# Patient Record
Sex: Female | Born: 1970 | Race: Asian | Hispanic: No | Marital: Single | Smoking: Never smoker
Health system: Southern US, Community
[De-identification: ages and names within clinical notes are randomized; demographics above are authoritative.]

## PROBLEM LIST (undated history)

## (undated) DIAGNOSIS — E785 Hyperlipidemia, unspecified: Secondary | ICD-10-CM

## (undated) DIAGNOSIS — I10 Essential (primary) hypertension: Secondary | ICD-10-CM

## (undated) DIAGNOSIS — R7303 Prediabetes: Secondary | ICD-10-CM

## (undated) HISTORY — DX: Hyperlipidemia, unspecified: E78.5

## (undated) HISTORY — DX: Prediabetes: R73.03

## (undated) HISTORY — DX: Essential (primary) hypertension: I10

---

## 1997-03-24 ENCOUNTER — Inpatient Hospital Stay (HOSPITAL_COMMUNITY): Admission: AD | Admit: 1997-03-24 | Discharge: 1997-03-25 | Payer: Self-pay | Admitting: Obstetrics

## 2004-12-27 ENCOUNTER — Ambulatory Visit (HOSPITAL_COMMUNITY): Admission: RE | Admit: 2004-12-27 | Discharge: 2004-12-27 | Payer: Self-pay | Admitting: *Deleted

## 2011-08-01 ENCOUNTER — Ambulatory Visit (INDEPENDENT_AMBULATORY_CARE_PROVIDER_SITE_OTHER): Payer: PRIVATE HEALTH INSURANCE | Admitting: Family Medicine

## 2011-08-01 VITALS — BP 150/94 | HR 77 | Temp 98.5°F | Resp 16 | Ht 61.5 in | Wt 185.8 lb

## 2011-08-01 DIAGNOSIS — R8281 Pyuria: Secondary | ICD-10-CM

## 2011-08-01 DIAGNOSIS — R82998 Other abnormal findings in urine: Secondary | ICD-10-CM

## 2011-08-01 DIAGNOSIS — R1031 Right lower quadrant pain: Secondary | ICD-10-CM

## 2011-08-01 DIAGNOSIS — N390 Urinary tract infection, site not specified: Secondary | ICD-10-CM

## 2011-08-01 LAB — POCT URINALYSIS DIPSTICK
Bilirubin, UA: NEGATIVE
Blood, UA: NEGATIVE
Glucose, UA: NEGATIVE
Nitrite, UA: NEGATIVE
Protein, UA: NEGATIVE
Spec Grav, UA: 1.015
Urobilinogen, UA: 0.2
pH, UA: 7

## 2011-08-01 LAB — POCT CBC
Granulocyte percent: 66.5 %G (ref 37–80)
HCT, POC: 43.2 % (ref 37.7–47.9)
Hemoglobin: 13 g/dL (ref 12.2–16.2)
Lymph, poc: 2.4 (ref 0.6–3.4)
MCH, POC: 27.4 pg (ref 27–31.2)
MCHC: 30.1 g/dL — AB (ref 31.8–35.4)
MCV: 91.2 fL (ref 80–97)
POC Granulocyte: 6.1 (ref 2–6.9)
POC LYMPH PERCENT: 26.3 %L (ref 10–50)
POC MID %: 7.2 %M (ref 0–12)
Platelet Count, POC: 362 10*3/uL (ref 142–424)
RDW, POC: 13.4 %
WBC: 9.1 10*3/uL (ref 4.6–10.2)

## 2011-08-01 LAB — POCT UA - MICROSCOPIC ONLY
Casts, Ur, LPF, POC: NEGATIVE
Crystals, Ur, HPF, POC: NEGATIVE
Yeast, UA: NEGATIVE

## 2011-08-01 LAB — POCT URINE PREGNANCY: Preg Test, Ur: NEGATIVE

## 2011-08-01 MED ORDER — NITROFURANTOIN MONOHYD MACRO 100 MG PO CAPS: 100.0000 mg | ORAL_CAPSULE | Freq: Two times a day (BID) | ORAL | Status: AC

## 2011-08-01 NOTE — Progress Notes (Signed)
Subjective:    Patient ID: Kristin Matthews, female    DOB: [birth year], 41 y.o.   MRN: [id number]  HPI Kristin Matthews is a 40 y.o. female R sided abdominal pain - started 2 days ago.  NKI.  Worse then, some better now.  No measured fever.  Feels hot at times, but no measured fever. No vomiting. Feels sick - "not feeling well", but not nausea..  Normal BM this am. LMP - normal last month - around June 20th.  Hurts to eat, but has been able to keep food down.   No alcohol, no tobacco.   Works in Development worker, community.    Review of Systems  Constitutional: Positive for fever.       Subjective fever.  Gastrointestinal: Positive for abdominal pain. Negative for nausea, vomiting and blood in stool.  Genitourinary: Negative for dysuria, urgency, frequency, hematuria, vaginal bleeding, vaginal discharge, difficulty urinating and vaginal pain.      Objective:   Physical Exam  Constitutional: She is oriented to person, place, and time. She appears well-developed and well-nourished.  HENT:  Head: Normocephalic and atraumatic.  Pulmonary/Chest: Effort normal.  Abdominal: Soft. Normal appearance and bowel sounds are normal. She exhibits no distension, no ascites and no mass. There is no hepatosplenomegaly. There is tenderness in the right lower quadrant. There is tenderness at McBurney's point. There is no rigidity, no rebound, no guarding, no CVA tenderness and negative Murphy's sign. No hernia.         Negative heel jar, negative ileopsoas sign.  Musculoskeletal:       Right hip: She exhibits no tenderness.  Neurological: She is alert and oriented to person, place, and time.  Skin: Skin is warm and dry. No rash noted.  Psychiatric: She has a normal mood and affect. Her behavior is normal. Thought content normal.    Results for orders placed in visit on 08/01/11  POCT URINALYSIS DIPSTICK      Component Value Range   Color, UA yellow     Clarity, UA cloudy     Glucose, UA neg     Bilirubin, UA neg     Ketones,  UA neg     Spec Grav, UA 1.015     Blood, UA neg     pH, UA 7.0     Protein, UA neg     Urobilinogen, UA 0.2     Nitrite, UA neg     Leukocytes, UA small (1+)    POCT UA - MICROSCOPIC ONLY      Component Value Range   WBC, Ur, HPF, POC 10-15     RBC, urine, microscopic 1-3     Bacteria, U Microscopic 1+     Mucus, UA small     Epithelial cells, urine per micros 5-8     Crystals, Ur, HPF, POC neg     Casts, Ur, LPF, POC neg     Yeast, UA neg    POCT CBC      Component Value Range   WBC 9.1  4.6 - 10.2 K/uL   Lymph, poc 2.4  0.6 - 3.4   POC LYMPH PERCENT 26.3  10 - 50 %L   MID (cbc) 0.7  0 - 0.9   POC MID % 7.2  0 - 12 %M   POC Granulocyte 6.1  2 - 6.9   Granulocyte percent 66.5  37 - 80 %G   RBC 4.74  4.04 - 5.48 M/uL   Hemoglobin 13.0  12.2 -  16.2 g/dL   HCT, POC 16.1  09.6 - 47.9 %   MCV 91.2  80 - 97 fL   MCH, POC 27.4  27 - 31.2 pg   MCHC 30.1 (*) 31.8 - 35.4 g/dL   RDW, POC 04.5     Platelet Count, POC 362  142 - 424 K/uL   MPV 10.2  0 - 99.8 fL  POCT URINE PREGNANCY      Component Value Range   Preg Test, Ur Negative         Assessment & Plan:  Kristin Matthews is a 41 y.o. female 1. RLQ abdominal pain  POCT urinalysis dipstick, POCT UA - Microscopic Only, POCT CBC, POCT urine pregnancy, Urine culture  2. Pyuria  Urine culture  3. UTI (lower urinary tract infection)  Urine culture   Reassuring CBC, and hx of improving symptoms pas 2 days.  Pyuria with lower abd pain - possible early UTI. DDX ovarian cyst based on timing of LMP.  Start macrobid 100mg  BID,  Check urine cx. Recheck in next 48 hours if not improving, sooner if any fever, vomiting, or worsening/changing abd pain. Understanding expressed.

## 2011-08-01 NOTE — Patient Instructions (Signed)
Your blood test is in normal range.  There were infection fighting cells in your urine that indicates you may have a bladder infection that is causing these symptoms.  Start antibiotic and drink plenty of fluids as discussed below.  If your pain is not continuing to improve in the next 2 days - return to clinic to recheck. Return to the clinic or go to the nearest emergency room if any of your symptoms worsen or new symptoms occur. Your should receive a call or letter about your lab results within the next week to 10 days.     Urinary Tract Infection Infections of the urinary tract can start in several places. A bladder infection (cystitis), a kidney infection (pyelonephritis), and a prostate infection (prostatitis) are different types of urinary tract infections (UTIs). They usually get better if treated with medicines (antibiotics) that kill germs. Take all the medicine until it is gone. You or your child may feel better in a few days, but TAKE ALL MEDICINE or the infection may not respond and may become more difficult to treat. HOME CARE INSTRUCTIONS   Drink enough water and fluids to keep the urine clear or pale yellow. Cranberry juice is especially recommended, in addition to large amounts of water.   Avoid caffeine, tea, and carbonated beverages. They tend to irritate the bladder.   Alcohol may irritate the prostate.   Only take over-the-counter or prescription medicines for pain, discomfort, or fever as directed by your caregiver.  To prevent further infections:  Empty the bladder often. Avoid holding urine for long periods of time.   After a bowel movement, women should cleanse from front to back. Use each tissue only once.   Empty the bladder before and after sexual intercourse.  FINDING OUT THE RESULTS OF YOUR TEST Not all test results are available during your visit. If your or your child's test results are not back during the visit, make an appointment with your caregiver to find  out the results. Do not assume everything is normal if you have not heard from your caregiver or the medical facility. It is important for you to follow up on all test results. SEEK MEDICAL CARE IF:   There is back pain.   Your baby is older than 3 months with a rectal temperature of 100.5 F (38.1 C) or higher for more than 1 day.   Your or your child's problems (symptoms) are no better in 3 days. Return sooner if you or your child is getting worse.  SEEK IMMEDIATE MEDICAL CARE IF:   There is severe back pain or lower abdominal pain.   You or your child develops chills.   You have a fever.   Your baby is older than 3 months with a rectal temperature of 102 F (38.9 C) or higher.   Your baby is 83 months old or younger with a rectal temperature of 100.4 F (38 C) or higher.   There is nausea or vomiting.   There is continued burning or discomfort with urination.  MAKE SURE YOU:   Understand these instructions.   Will watch your condition.   Will get help right away if you are not doing well or get worse.  Document Released: 10/25/2004 Document Revised: 01/04/2011 Document Reviewed: 05/30/2006 [facility] Patient Information 2012 [place], [place].

## 2011-08-03 LAB — URINE CULTURE: Colony Count: 8000

## 2011-08-07 ENCOUNTER — Encounter: Payer: Self-pay | Admitting: Family Medicine

## 2011-11-10 ENCOUNTER — Other Ambulatory Visit: Payer: Self-pay | Admitting: Radiology

## 2011-11-10 ENCOUNTER — Ambulatory Visit (INDEPENDENT_AMBULATORY_CARE_PROVIDER_SITE_OTHER): Payer: PRIVATE HEALTH INSURANCE | Admitting: Family Medicine

## 2011-11-10 VITALS — BP 148/104 | HR 68 | Temp 98.2°F | Resp 18 | Ht 61.25 in | Wt 184.4 lb

## 2011-11-10 DIAGNOSIS — H1030 Unspecified acute conjunctivitis, unspecified eye: Secondary | ICD-10-CM

## 2011-11-10 MED ORDER — CIPROFLOXACIN HCL 0.3 % OP SOLN: 1.0000 [drp] | OPHTHALMIC | Status: DC

## 2011-11-10 NOTE — Telephone Encounter (Signed)
The eye drops did not go through.

## 2011-11-15 ENCOUNTER — Telehealth: Payer: Self-pay

## 2011-11-15 ENCOUNTER — Ambulatory Visit (INDEPENDENT_AMBULATORY_CARE_PROVIDER_SITE_OTHER): Payer: PRIVATE HEALTH INSURANCE | Admitting: Family Medicine

## 2011-11-15 VITALS — BP 132/88 | HR 79 | Temp 98.7°F | Resp 16 | Ht 61.5 in | Wt 171.0 lb

## 2011-11-15 DIAGNOSIS — H209 Unspecified iridocyclitis: Secondary | ICD-10-CM

## 2011-11-15 MED ORDER — PREDNISOLONE ACETATE 1 % OP SUSP: 1.0000 [drp] | Freq: Two times a day (BID) | OPHTHALMIC | Status: DC

## 2011-11-15 NOTE — Telephone Encounter (Signed)
Pt would like for someone to contact her concerning her eye she was in for ov and states she called a couple days ago and no response. [phone]

## 2011-11-15 NOTE — Progress Notes (Signed)
41 yo woman with red left eye  X 1 week with dull headache and hazy vision.  She was seen 5 days ago and given ciloxan which has not helped.  Objective:  Markedly injected left eye Fundus:  Poorly visualized. EOMI:   Flourescein:  No uptake  Assessment:  Acute iritis  Plan:  predforte 1. Iritis  prednisoLONE acetate (PRED FORTE) 1 % ophthalmic suspension, Ambulatory referral to Ophthalmology

## 2011-11-15 NOTE — Patient Instructions (Signed)
We will call you tomorrow to refer you to the eye specialist.   Take one drop of new medicine tonight and one tomorrow morning.

## 2012-04-25 NOTE — Progress Notes (Signed)
  Subjective:    Patient ID: Kristin Matthews, female    DOB: 1970-09-25, 42 y.o.   MRN: [id number] Chief Complaint  Patient presents with  . Conjunctivitis    x yesterday   HPI  Bilateral red eyes dev yesterday.  Matted when she awoke this morning. Scratchy and painful.  No known contacts.  No past medical history on file. No current outpatient prescriptions on file prior to visit.   No current facility-administered medications on file prior to visit.   No Known Allergies  Review of Systems  Constitutional: Negative for fever, chills, diaphoresis and activity change.  HENT: Negative for ear pain, congestion, sore throat, rhinorrhea, neck pain, neck stiffness, dental problem, sinus pressure and tinnitus.   Eyes: Positive for photophobia, pain, discharge, redness, itching and visual disturbance.  Skin: Negative for rash.  Neurological: Negative for dizziness, syncope, facial asymmetry, weakness, light-headedness, numbness and headaches.  Hematological: Negative for adenopathy.  Psychiatric/Behavioral: Negative for sleep disturbance.      BP 148/104  Pulse 68  Temp(Src) 98.2 F (36.8 C) (Oral)  Resp 18  Ht 5' 1.25" (1.556 m)  Wt 184 lb 6.4 oz (83.643 kg)  BMI 34.55 kg/m2  SpO2 100% Objective:   Physical Exam  Constitutional: She is oriented to person, place, and time. She appears well-developed and well-nourished. No distress.  HENT:  Head: Normocephalic and atraumatic.  Right Ear: External ear normal.  Eyes: EOM are normal. Pupils are equal, round, and reactive to light. Right eye exhibits discharge. Right eye exhibits no chemosis and no exudate. Left eye exhibits discharge. Left eye exhibits no chemosis and no exudate. Right conjunctiva is injected. Left conjunctiva is injected. No scleral icterus.  Fundoscopic exam:      The right eye shows no hemorrhage and no papilledema.       The left eye shows no hemorrhage and no papilledema.  Bilateral purulent discharge   Pulmonary/Chest: Effort normal.  Neurological: She is alert and oriented to person, place, and time.  Skin: Skin is warm and dry. She is not diaphoretic. No erythema.  Psychiatric: She has a normal mood and affect. Her behavior is normal.      Assessment & Plan:  Conjunctivitis, acute  Meds ordered this encounter  Medications  . ciprofloxacin (CILOXAN) 0.3 % ophthalmic solution    Sig: Place 1 drop into the left eye every 2 (two) hours. Administer 1 drop, every 2 hours, while awake, for 2 days. Then 1 drop, every 4 hours, while awake, for the next 5 days.    Dispense:  5 mL    Refill:  0

## 2013-07-01 ENCOUNTER — Ambulatory Visit (INDEPENDENT_AMBULATORY_CARE_PROVIDER_SITE_OTHER): Payer: BC Managed Care – PPO | Admitting: Physician Assistant

## 2013-07-01 VITALS — BP 125/75 | HR 71 | Temp 98.1°F | Resp 16 | Ht 61.0 in | Wt 191.4 lb

## 2013-07-01 DIAGNOSIS — Z124 Encounter for screening for malignant neoplasm of cervix: Secondary | ICD-10-CM

## 2013-07-01 DIAGNOSIS — Z113 Encounter for screening for infections with a predominantly sexual mode of transmission: Secondary | ICD-10-CM

## 2013-07-01 DIAGNOSIS — Z0000 Encounter for general adult medical examination without abnormal findings: Secondary | ICD-10-CM

## 2013-07-01 DIAGNOSIS — Z1239 Encounter for other screening for malignant neoplasm of breast: Secondary | ICD-10-CM

## 2013-07-01 LAB — POCT URINALYSIS DIPSTICK
Bilirubin, UA: NEGATIVE
Blood, UA: NEGATIVE
Glucose, UA: NEGATIVE
Ketones, UA: NEGATIVE
Nitrite, UA: NEGATIVE
PROTEIN UA: NEGATIVE
Spec Grav, UA: 1.015
Urobilinogen, UA: 0.2
pH, UA: 8

## 2013-07-01 LAB — COMPREHENSIVE METABOLIC PANEL
ALK PHOS: 62 U/L (ref 39–117)
ALT: 11 U/L (ref 0–35)
AST: 12 U/L (ref 0–37)
Albumin: 4.2 g/dL (ref 3.5–5.2)
BUN: 10 mg/dL (ref 6–23)
CO2: 26 mEq/L (ref 19–32)
Calcium: 8.8 mg/dL (ref 8.4–10.5)
Chloride: 105 mEq/L (ref 96–112)
Creat: 0.49 mg/dL — ABNORMAL LOW (ref 0.50–1.10)
Glucose, Bld: 95 mg/dL (ref 70–99)
Potassium: 3.9 mEq/L (ref 3.5–5.3)
Sodium: 138 mEq/L (ref 135–145)
TOTAL PROTEIN: 7.1 g/dL (ref 6.0–8.3)
Total Bilirubin: 0.3 mg/dL (ref 0.2–1.2)

## 2013-07-01 LAB — LIPID PANEL
CHOL/HDL RATIO: 5.2 ratio
Cholesterol: 199 mg/dL (ref 0–200)
HDL: 38 mg/dL — ABNORMAL LOW (ref >39)
LDL Cholesterol: 141 mg/dL — ABNORMAL HIGH (ref 0–99)
Triglycerides: 98 mg/dL (ref <150)
VLDL: 20 mg/dL (ref 0–40)

## 2013-07-01 LAB — POCT CBC
Granulocyte percent: 65.1 %G (ref 37–80)
HCT, POC: 39.1 % (ref 37.7–47.9)
Hemoglobin: 11.9 g/dL — AB (ref 12.2–16.2)
Lymph, poc: 2.2 (ref 0.6–3.4)
MCH, POC: 27.4 pg (ref 27–31.2)
MCHC: 30.4 g/dL — AB (ref 31.8–35.4)
MCV: 90.2 fL (ref 80–97)
MID (cbc): 0.6 (ref 0–0.9)
MPV: 10 fL (ref 0–99.8)
POC GRANULOCYTE: 5.1 (ref 2–6.9)
POC LYMPH PERCENT: 27.6 %L (ref 10–50)
POC MID %: 7.3 %M (ref 0–12)
Platelet Count, POC: 339 10*3/uL (ref 142–424)
RBC: 4.34 M/uL (ref 4.04–5.48)
RDW, POC: 13.5 %
WBC: 7.8 10*3/uL (ref 4.6–10.2)

## 2013-07-01 LAB — TSH: TSH: 2.515 u[IU]/mL (ref 0.350–4.500)

## 2013-07-01 NOTE — Patient Instructions (Signed)
I will let you know when your lab results are back - this will take several days  Let me know if you would like me to refer you for a mammogram  Continue making healthy food choices.  Work on incorporating exercise into your daily routine.  The recommendation is to get 150 minutes of moderate intensity exercise each week  Health Maintenance, Female A healthy lifestyle and preventative care can promote health and wellness.  Maintain regular health, dental, and eye exams.  Eat a healthy diet. Foods like vegetables, fruits, whole grains, low-fat dairy products, and lean protein foods contain the nutrients you need without too many calories. Decrease your intake of foods high in solid fats, added sugars, and salt. Get information about a proper diet from your caregiver, if necessary.  Regular physical exercise is one of the most important things you can do for your health. Most adults should get at least 150 minutes of moderate-intensity exercise (any activity that increases your heart rate and causes you to sweat) each week. In addition, most adults need muscle-strengthening exercises on 2 or more days a week.   Maintain a healthy weight. The body mass index (BMI) is a screening tool to identify possible weight problems. It provides an estimate of body fat based on height and weight. Your caregiver can help determine your BMI, and can help you achieve or maintain a healthy weight. For adults 20 years and older:  A BMI below 18.5 is considered underweight.  A BMI of 18.5 to 24.9 is normal.  A BMI of 25 to 29.9 is considered overweight.  A BMI of 30 and above is considered obese.  Maintain normal blood lipids and cholesterol by exercising and minimizing your intake of saturated fat. Eat a balanced diet with plenty of fruits and vegetables. Blood tests for lipids and cholesterol should begin at age 55 and be repeated every 5 years. If your lipid or cholesterol levels are high, you are over 50, or  you are a high risk for heart disease, you may need your cholesterol levels checked more frequently.Ongoing high lipid and cholesterol levels should be treated with medicines if diet and exercise are not effective.  If you smoke, find out from your caregiver how to quit. If you do not use tobacco, do not start.  Lung cancer screening is recommended for adults aged 6 80 years who are at high risk for developing lung cancer because of a history of smoking. Yearly low-dose computed tomography (CT) is recommended for people who have at least a 30-pack-year history of smoking and are a current smoker or have quit within the past 15 years. A pack year of smoking is smoking an average of 1 pack of cigarettes a day for 1 year (for example: 1 pack a day for 30 years or 2 packs a day for 15 years). Yearly screening should continue until the smoker has stopped smoking for at least 15 years. Yearly screening should also be stopped for people who develop a health problem that would prevent them from having lung cancer treatment.  If you are pregnant, do not drink alcohol. If you are breastfeeding, be very cautious about drinking alcohol. If you are not pregnant and choose to drink alcohol, do not exceed 1 drink per day. One drink is considered to be 12 ounces (355 mL) of beer, 5 ounces (148 mL) of wine, or 1.5 ounces (44 mL) of liquor.  Avoid use of street drugs. Do not share needles with anyone. Ask  for help if you need support or instructions about stopping the use of drugs.  High blood pressure causes heart disease and increases the risk of stroke. Blood pressure should be checked at least every 1 to 2 years. Ongoing high blood pressure should be treated with medicines, if weight loss and exercise are not effective.  If you are 60 to 43 years old, ask your caregiver if you should take aspirin to prevent strokes.  Diabetes screening involves taking a blood sample to check your fasting blood sugar level. This  should be done once every 3 years, after age 60, if you are within normal weight and without risk factors for diabetes. Testing should be considered at a younger age or be carried out more frequently if you are overweight and have at least 1 risk factor for diabetes.  Breast cancer screening is essential preventative care for women. You should practice "breast self-awareness." This means understanding the normal appearance and feel of your breasts and may include breast self-examination. Any changes detected, no matter how small, should be reported to a caregiver. Women in their 1s and 30s should have a clinical breast exam (CBE) by a caregiver as part of a regular health exam every 1 to 3 years. After age 78, women should have a CBE every year. Starting at age 77, women should consider having a mammogram (breast X-ray) every year. Women who have a family history of breast cancer should talk to their caregiver about genetic screening. Women at a high risk of breast cancer should talk to their caregiver about having an MRI and a mammogram every year.  Breast cancer gene (BRCA)-related cancer risk assessment is recommended for women who have family members with BRCA-related cancers. BRCA-related cancers include breast, ovarian, tubal, and peritoneal cancers. Having family members with these cancers may be associated with an increased risk for harmful changes (mutations) in the breast cancer genes BRCA1 and BRCA2. Results of the assessment will determine the need for genetic counseling and BRCA1 and BRCA2 testing.  The Pap test is a screening test for cervical cancer. Women should have a Pap test starting at age 23. Between ages 54 and 83, Pap tests should be repeated every 2 years. Beginning at age 68, you should have a Pap test every 3 years as long as the past 3 Pap tests have been normal. If you had a hysterectomy for a problem that was not cancer or a condition that could lead to cancer, then you no longer  need Pap tests. If you are between ages 88 and 63, and you have had normal Pap tests going back 10 years, you no longer need Pap tests. If you have had past treatment for cervical cancer or a condition that could lead to cancer, you need Pap tests and screening for cancer for at least 20 years after your treatment. If Pap tests have been discontinued, risk factors (such as a new sexual partner) need to be reassessed to determine if screening should be resumed. Some women have medical problems that increase the chance of getting cervical cancer. In these cases, your caregiver may recommend more frequent screening and Pap tests.  The human papillomavirus (HPV) test is an additional test that may be used for cervical cancer screening. The HPV test looks for the virus that can cause the cell changes on the cervix. The cells collected during the Pap test can be tested for HPV. The HPV test could be used to screen women aged 47 years and  older, and should be used in women of any age who have unclear Pap test results. After the age of 44, women should have HPV testing at the same frequency as a Pap test.  Colorectal cancer can be detected and often prevented. Most routine colorectal cancer screening begins at the age of 48 and continues through age 74. However, your caregiver may recommend screening at an earlier age if you have risk factors for colon cancer. On a yearly basis, your caregiver may provide home test kits to check for hidden blood in the stool. Use of a small camera at the end of a tube, to directly examine the colon (sigmoidoscopy or colonoscopy), can detect the earliest forms of colorectal cancer. Talk to your caregiver about this at age 74, when routine screening begins. Direct examination of the colon should be repeated every 5 to 10 years through age 79, unless early forms of pre-cancerous polyps or small growths are found.  Hepatitis C blood testing is recommended for all people born from [birth year]  through 1965 and any individual with known risks for hepatitis C.  Practice safe sex. Use condoms and avoid high-risk sexual practices to reduce the spread of sexually transmitted infections (STIs). Sexually active women aged 49 and younger should be checked for Chlamydia, which is a common sexually transmitted infection. Older women with new or multiple partners should also be tested for Chlamydia. Testing for other STIs is recommended if you are sexually active and at increased risk.  Osteoporosis is a disease in which the bones lose minerals and strength with aging. This can result in serious bone fractures. The risk of osteoporosis can be identified using a bone density scan. Women ages 11 and over and women at risk for fractures or osteoporosis should discuss screening with their caregivers. Ask your caregiver whether you should be taking a calcium supplement or vitamin D to reduce the rate of osteoporosis.  Menopause can be associated with physical symptoms and risks. Hormone replacement therapy is available to decrease symptoms and risks. You should talk to your caregiver about whether hormone replacement therapy is right for you.  Use sunscreen. Apply sunscreen liberally and repeatedly throughout the day. You should seek shade when your shadow is shorter than you. Protect yourself by wearing long sleeves, pants, a wide-brimmed hat, and sunglasses year round, whenever you are outdoors.  Notify your caregiver of new moles or changes in moles, especially if there is a change in shape or color. Also notify your caregiver if a mole is larger than the size of a pencil eraser.  Stay current with your immunizations. Document Released: 07/31/2010 Document Revised: 05/12/2012 Document Reviewed: 07/31/2010 [facility] Patient Information 2014 [place].

## 2013-07-01 NOTE — Progress Notes (Signed)
Subjective:    Patient ID: Kristin Matthews, female    DOB: July 16, 1970, 43 y.o.   MRN: [id number]  HPI  Kristin Matthews is a very pleasant 43 yr old female here for CPE.  Last physical 3 yrs ago.  Identifies [facility] as PCP  Complaints:  none LMP:  3 wks ago (unsure of date), regular every month Sexually active, no specific concern for STI but would like testing today; asymptomatic Contraception:  Condoms 100% of the time GYN:  Last pap unknown - more than 3 yrs - unknown if abnormal; has had mammo "awhile" ago, would like ref for this today Dentist:  Every 6 months Eye doctor:  None, but plans to see eye doctor Imm:  Unknown - thinks tetanus within 5 yrs Diet:  Varied diet; tries to stay away from fast food; mostly water to drink Exercise:  none Meds: none Family history: unknown - pt is adopted Tobacco: none Etoh:  Very rarely  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   HENT: Negative.   Respiratory: Negative.   Cardiovascular: Negative.   Gastrointestinal: Negative.   Genitourinary: Negative.   Musculoskeletal: Negative.   Skin: Negative.        Objective:   Physical Exam  Vitals reviewed. Constitutional: She is oriented to person, place, and time. She appears well-developed and well-nourished. No distress.  HENT:  Head: Normocephalic and atraumatic.  Right Ear: Tympanic membrane and ear canal normal.  Left Ear: Tympanic membrane and ear canal normal.  Mouth/Throat: Uvula is midline, oropharynx is clear and moist and mucous membranes are normal.  Eyes: Conjunctivae and EOM are normal. No scleral icterus.  Neck: Neck supple.  Cardiovascular: Normal rate, regular rhythm and normal heart sounds.   Pulmonary/Chest: Effort normal and breath sounds normal. She has no wheezes. She has no rales.  Abdominal: Soft. Bowel sounds are normal. There is no tenderness.  Lymphadenopathy:    She has no cervical adenopathy.  Neurological: She is alert and  oriented to person, place, and time.  Skin: Skin is warm and dry.  Psychiatric: She has a normal mood and affect. Her behavior is normal.   Results for orders placed in visit on 07/01/13  POCT CBC      Result Value Ref Range   WBC 7.8  4.6 - 10.2 K/uL   Lymph, poc 2.2  0.6 - 3.4   POC LYMPH PERCENT 27.6  10 - 50 %L   MID (cbc) 0.6  0 - 0.9   POC MID % 7.3  0 - 12 %M   POC Granulocyte 5.1  2 - 6.9   Granulocyte percent 65.1  37 - 80 %G   RBC 4.34  4.04 - 5.48 M/uL   Hemoglobin 11.9 (*) 12.2 - 16.2 g/dL   HCT, POC 40.939.1  81.137.7 - 47.9 %   MCV 90.2  80 - 97 fL   MCH, POC 27.4  27 - 31.2 pg   MCHC 30.4 (*) 31.8 - 35.4 g/dL   RDW, POC 91.413.5     Platelet Count, POC 339  142 - 424 K/uL   MPV 10.0  0 - 99.8 fL  POCT URINALYSIS DIPSTICK      Result Value Ref Range   Color, UA yellow     Clarity, UA clear     Glucose, UA neg     Bilirubin, UA neg     Ketones, UA neg     Spec Grav,  UA 1.015     Blood, UA neg     pH, UA 8.0     Protein, UA neg     Urobilinogen, UA 0.2     Nitrite, UA neg     Leukocytes, UA Trace         Assessment & Plan:  1. Routine general medical examination at a health care facility 43 yr old female here for CPE.  No concerns today.  Exam is normal.  Will check labs.  Pt to follow as concerns arise.  - POCT CBC - POCT urinalysis dipstick - Comprehensive metabolic panel - HIV antibody - Lipid panel - RPR - TSH - Pap IG, CT/NG w/ reflex HPV when ASC-U - MM DIGITAL SCREENING BILATERAL; Future  2. Screening for cervical cancer Last pap unknown but reports longer than 3 yrs ago.  Pap sent today.  Will also check gc/chlamydia  - Pap IG, CT/NG w/ reflex HPV when ASC-U  3. Screen for STD (sexually transmitted disease)  - HIV antibody - RPR - Pap IG, CT/NG w/ reflex HPV when ASC-U  4. Screening for breast cancer  - MM DIGITAL SCREENING BILATERAL; Future     E. Frances Furbish MHS, PA-C [facility] 6/3/20152:09 PM

## 2013-07-02 LAB — HIV ANTIBODY (ROUTINE TESTING W REFLEX): HIV 1&2 Ab, 4th Generation: NONREACTIVE

## 2013-07-02 LAB — RPR

## 2013-07-03 LAB — PAP IG, CT-NG, RFX HPV ASCU
Chlamydia Probe Amp: NEGATIVE
GC Probe Amp: NEGATIVE

## 2013-12-20 ENCOUNTER — Ambulatory Visit (INDEPENDENT_AMBULATORY_CARE_PROVIDER_SITE_OTHER): Payer: BC Managed Care – PPO | Admitting: Emergency Medicine

## 2013-12-20 ENCOUNTER — Ambulatory Visit (INDEPENDENT_AMBULATORY_CARE_PROVIDER_SITE_OTHER): Payer: BC Managed Care – PPO

## 2013-12-20 VITALS — BP 122/84 | HR 84 | Temp 98.3°F | Resp 19 | Ht 61.0 in | Wt 190.2 lb

## 2013-12-20 DIAGNOSIS — Z3200 Encounter for pregnancy test, result unknown: Secondary | ICD-10-CM

## 2013-12-20 DIAGNOSIS — R05 Cough: Secondary | ICD-10-CM

## 2013-12-20 DIAGNOSIS — R0981 Nasal congestion: Secondary | ICD-10-CM

## 2013-12-20 DIAGNOSIS — J029 Acute pharyngitis, unspecified: Secondary | ICD-10-CM

## 2013-12-20 DIAGNOSIS — Z3202 Encounter for pregnancy test, result negative: Secondary | ICD-10-CM

## 2013-12-20 DIAGNOSIS — R059 Cough, unspecified: Secondary | ICD-10-CM

## 2013-12-20 LAB — POCT CBC
Granulocyte percent: 66.3 %G (ref 37–80)
HCT, POC: 41.4 % (ref 37.7–47.9)
Hemoglobin: 13 g/dL (ref 12.2–16.2)
Lymph, poc: 2.6 (ref 0.6–3.4)
MCH, POC: 27.2 pg (ref 27–31.2)
MCHC: 31.5 g/dL — AB (ref 31.8–35.4)
MCV: 86.4 fL (ref 80–97)
MID (cbc): 0.5 (ref 0–0.9)
MPV: 8.1 fL (ref 0–99.8)
POC Granulocyte: 6.1 (ref 2–6.9)
POC LYMPH PERCENT: 28.8 %L (ref 10–50)
POC MID %: 4.9 %M (ref 0–12)
Platelet Count, POC: 329 10*3/uL (ref 142–424)
RBC: 4.8 M/uL (ref 4.04–5.48)
RDW, POC: 14 %
WBC: 9.2 10*3/uL (ref 4.6–10.2)

## 2013-12-20 LAB — POCT URINE PREGNANCY: Preg Test, Ur: NEGATIVE

## 2013-12-20 LAB — POCT RAPID STREP A (OFFICE): Rapid Strep A Screen: NEGATIVE

## 2013-12-20 MED ORDER — ALBUTEROL SULFATE (2.5 MG/3ML) 0.083% IN NEBU
2.5000 mg | INHALATION_SOLUTION | Freq: Once | RESPIRATORY_TRACT | Status: AC
  Administered 2013-12-20: 2.5 mg via RESPIRATORY_TRACT

## 2013-12-20 MED ORDER — ALBUTEROL SULFATE HFA 108 (90 BASE) MCG/ACT IN AERS: 2.0000 | INHALATION_SPRAY | Freq: Four times a day (QID) | RESPIRATORY_TRACT | Status: DC | PRN

## 2013-12-20 MED ORDER — BENZONATATE 100 MG PO CAPS: 100.0000 mg | ORAL_CAPSULE | Freq: Three times a day (TID) | ORAL | Status: DC | PRN

## 2013-12-20 NOTE — Patient Instructions (Signed)
Cough, Adult  A cough is a reflex that helps clear your throat and airways. It can help heal the body or may be a reaction to an irritated airway. A cough may only last 2 or 3 weeks (acute) or may last more than 8 weeks (chronic).  CAUSES Acute cough:  Viral or bacterial infections. Chronic cough:  Infections.  Allergies.  Asthma.  Post-nasal drip.  Smoking.  Heartburn or acid reflux.  Some medicines.  Chronic lung problems (COPD).  Cancer. SYMPTOMS   Cough.  Fever.  Chest pain.  Increased breathing rate.  High-pitched whistling sound when breathing (wheezing).  Colored mucus that you cough up (sputum). TREATMENT   A bacterial cough may be treated with antibiotic medicine.  A viral cough must run its course and will not respond to antibiotics.  Your caregiver may recommend other treatments if you have a chronic cough. HOME CARE INSTRUCTIONS   Only take over-the-counter or prescription medicines for pain, discomfort, or fever as directed by your caregiver. Use cough suppressants only as directed by your caregiver.  Use a cold steam vaporizer or humidifier in your bedroom or home to help loosen secretions.  Sleep in a semi-upright position if your cough is worse at night.  Rest as needed.  Stop smoking if you smoke. SEEK IMMEDIATE MEDICAL CARE IF:   You have pus in your sputum.  Your cough starts to worsen.  You cannot control your cough with suppressants and are losing sleep.  You begin coughing up blood.  You have difficulty breathing.  You develop pain which is getting worse or is uncontrolled with medicine.  You have a fever. MAKE SURE YOU:   Understand these instructions.  Will watch your condition.  Will get help right away if you are not doing well or get worse. Document Released: 07/14/2010 Document Revised: 04/09/2011 Document Reviewed: 07/14/2010 ExitCare Patient Information 2015 [employer]. This information is not intended  to replace advice given to you by your health care provider. Make sure you discuss any questions you have with your health care provider.  

## 2013-12-20 NOTE — Progress Notes (Addendum)
   Subjective:  This chart was scribed for Earl LitesSteve Anjela Cassara, MD by Haywood PaoNadim Abu Hashem, ED Scribe at [facility].The patient was seen in exam room 10 and the patient's care was started at 3:20 PM.   Patient ID: Kristin BanksHoan Ciulla, female    DOB: Oct 02, 1970, 43 y.o.   MRN: [id number]  HPI  HPI Comments: Kristin BanksHoan Gorrell is a 43 y.o. female who [facility] complaining of flu like symptoms, her complaint began 5 days ago. Pt has a producitve cough, sore throat, HA, nasal congestion, fever, chills and body aches as associated symptoms. He has tried several over the counter medications and none of provided relief. She traveled to [place] 2[place] weeks ago. She denies sick contacts. Pt has not gotten the flu shot this year. Pt did not know when her LNMP was.    Review of Systems  Constitutional: Positive for fever and chills.  HENT: Positive for sinus pressure and sore throat.   Respiratory: Positive for cough.   Musculoskeletal: Positive for myalgias.  Neurological: Positive for headaches.       Objective:  BP 122/84 mmHg  Pulse 84  Temp(Src) 98.3 F (36.8 C) (Oral)  Resp 19  Ht 5\' 1"  (1.549 m)  Wt 190 lb 3.2 oz (86.274 kg)  BMI 35.96 kg/m2  SpO2 95%  LMP 12/10/2013  Physical Exam  Constitutional: She appears well-developed.  HENT:  Right Ear: External ear normal.  Left Ear: External ear normal.  Eyes: Pupils are equal, round, and reactive to light.  Neck: No thyromegaly present.  Cardiovascular: Normal rate and regular rhythm.   Pulmonary/Chest:  There is good air exchange. There are bilateral rhonchi present with mild prolongation of expiration. There are no areas of dullness.   [facility] reading (PRIMARY) by  Dr. Cleta Albertsaub no acute disease Results for orders placed or performed in visit on 12/20/13  POCT urine pregnancy  Result Value Ref Range   Preg Test, Ur Negative   POCT CBC  Result Value Ref Range   WBC 9.2 4.6 - 10.2 K/uL   Lymph, poc 2.6 0.6 - 3.4   POC LYMPH PERCENT 28.8 10  - 50 %L   MID (cbc) 0.5 0 - 0.9   POC MID % 4.9 0 - 12 %M   POC Granulocyte 6.1 2 - 6.9   Granulocyte percent 66.3 37 - 80 %G   RBC 4.80 4.04 - 5.48 M/uL   Hemoglobin 13.0 12.2 - 16.2 g/dL   HCT, POC 40.941.4 81.137.7 - 47.9 %   MCV 86.4 80 - 97 fL   MCH, POC 27.2 27 - 31.2 pg   MCHC 31.5 (A) 31.8 - 35.4 g/dL   RDW, POC 91.414.0 %   Platelet Count, POC 329 142 - 424 K/uL   MPV 8.1 0 - 99.8 fL         Assessment & Plan:  I suspect she had a viral type illness now with reactive airways disease and cough. She did feel better after an albuterol treatment. Will treat with Mucinex twice a day along with Tessalon Perles and an albuterol inhaler.I personally performed the services described in this documentation, which was scribed in my presence. The recorded information has been reviewed and is accurate.

## 2013-12-21 ENCOUNTER — Telehealth: Payer: Self-pay

## 2013-12-21 MED ORDER — BENZONATATE 100 MG PO CAPS: 100.0000 mg | ORAL_CAPSULE | Freq: Three times a day (TID) | ORAL | Status: DC | PRN

## 2013-12-21 MED ORDER — ALBUTEROL SULFATE HFA 108 (90 BASE) MCG/ACT IN AERS: 2.0000 | INHALATION_SPRAY | Freq: Four times a day (QID) | RESPIRATORY_TRACT | Status: DC | PRN

## 2013-12-21 NOTE — Progress Notes (Signed)
Pt called- pharmacy did not receive her prescriptions yesterday. I have resent this morning. Pt aware.

## 2013-12-21 NOTE — Telephone Encounter (Signed)
I called for these smaller inhalers a few weeks ago and they have been discontinued.

## 2013-12-21 NOTE — Telephone Encounter (Signed)
There is a small sized inhaler of Ventolin that may be less expensive. She may want to try [facility] or [facility] pharmacies, which may have a lower price on these items.

## 2013-12-21 NOTE — Telephone Encounter (Signed)
Please recommend that the patient contact her insurance company and request a prescription drug formulary for her plan. We can then select their preferred products.

## 2013-12-21 NOTE — Telephone Encounter (Signed)
Pt called and reported that her meds are going to cost her $80 and she wants something less expensive if possible. Advised her that I will call pharm to see if there is anything we can do to reduce cost. Pharmacist reported ins is covering both, but co-pay for benzonatate is $13 and the albuterol is $66. Asked him to try running for a different inhaler and Pro-Air was only $51, but still too exp for pt. Are there any changes that can be made to treatment?

## 2013-12-21 NOTE — Addendum Note (Signed)
Addended by: Elease EtienneHANSEN, SARA A on: 12/21/2013 09:30 AM   Modules accepted: Orders

## 2013-12-23 NOTE — Telephone Encounter (Signed)
Lm for rtn call 

## 2013-12-25 NOTE — Telephone Encounter (Signed)
Called again. Left another message for her to call back. Pro Air is preferred with her insurance, but is still $51, there is not a generic alternative, she may want to try searching for a coupon, [facility] has one available.

## 2014-09-08 ENCOUNTER — Other Ambulatory Visit: Payer: Self-pay

## 2014-09-08 DIAGNOSIS — Z1231 Encounter for screening mammogram for malignant neoplasm of breast: Secondary | ICD-10-CM

## 2014-09-14 ENCOUNTER — Ambulatory Visit
Admission: RE | Admit: 2014-09-14 | Discharge: 2014-09-14 | Disposition: A | Discharge status: Home / Self Care | Payer: 59 | Source: Ambulatory Visit

## 2014-09-14 DIAGNOSIS — Z1231 Encounter for screening mammogram for malignant neoplasm of breast: Secondary | ICD-10-CM

## 2014-09-20 ENCOUNTER — Other Ambulatory Visit: Payer: Self-pay | Admitting: Family Medicine

## 2014-09-20 DIAGNOSIS — R928 Other abnormal and inconclusive findings on diagnostic imaging of breast: Secondary | ICD-10-CM

## 2014-10-05 ENCOUNTER — Other Ambulatory Visit: Payer: 59

## 2014-10-05 ENCOUNTER — Ambulatory Visit
Admission: RE | Admit: 2014-10-05 | Discharge: 2014-10-05 | Disposition: A | Discharge status: Home / Self Care | Payer: 59 | Source: Ambulatory Visit | Attending: Family Medicine | Admitting: Family Medicine

## 2014-10-05 DIAGNOSIS — R928 Other abnormal and inconclusive findings on diagnostic imaging of breast: Secondary | ICD-10-CM

## 2015-08-31 ENCOUNTER — Other Ambulatory Visit: Payer: Self-pay | Admitting: Family Medicine

## 2015-08-31 DIAGNOSIS — Z1231 Encounter for screening mammogram for malignant neoplasm of breast: Secondary | ICD-10-CM

## 2015-10-12 ENCOUNTER — Ambulatory Visit
Admission: RE | Admit: 2015-10-12 | Discharge: 2015-10-12 | Disposition: A | Discharge status: Home / Self Care | Payer: BLUE CROSS/BLUE SHIELD | Source: Ambulatory Visit | Attending: Family Medicine | Admitting: Family Medicine

## 2015-10-12 DIAGNOSIS — Z1231 Encounter for screening mammogram for malignant neoplasm of breast: Secondary | ICD-10-CM

## 2015-10-17 ENCOUNTER — Other Ambulatory Visit: Payer: Self-pay | Admitting: Family Medicine

## 2015-10-17 DIAGNOSIS — R928 Other abnormal and inconclusive findings on diagnostic imaging of breast: Secondary | ICD-10-CM

## 2015-10-25 ENCOUNTER — Ambulatory Visit
Admission: RE | Admit: 2015-10-25 | Discharge: 2015-10-25 | Disposition: A | Discharge status: Home / Self Care | Payer: BLUE CROSS/BLUE SHIELD | Source: Ambulatory Visit | Attending: Family Medicine | Admitting: Family Medicine

## 2015-10-25 ENCOUNTER — Other Ambulatory Visit: Payer: Self-pay | Admitting: Family Medicine

## 2015-10-25 DIAGNOSIS — R928 Other abnormal and inconclusive findings on diagnostic imaging of breast: Secondary | ICD-10-CM

## 2020-10-04 LAB — HM COLONOSCOPY

## 2021-02-28 ENCOUNTER — Other Ambulatory Visit (HOSPITAL_BASED_OUTPATIENT_CLINIC_OR_DEPARTMENT_OTHER): Payer: Self-pay | Admitting: Internal Medicine

## 2021-02-28 DIAGNOSIS — Z1231 Encounter for screening mammogram for malignant neoplasm of breast: Secondary | ICD-10-CM

## 2021-03-07 ENCOUNTER — Ambulatory Visit (HOSPITAL_BASED_OUTPATIENT_CLINIC_OR_DEPARTMENT_OTHER)
Admission: RE | Admit: 2021-03-07 | Discharge: 2021-03-07 | Disposition: A | Discharge status: Home / Self Care | Payer: 59 | Source: Ambulatory Visit | Attending: Internal Medicine | Admitting: Internal Medicine

## 2021-03-07 ENCOUNTER — Other Ambulatory Visit: Payer: Self-pay

## 2021-03-07 ENCOUNTER — Encounter (HOSPITAL_BASED_OUTPATIENT_CLINIC_OR_DEPARTMENT_OTHER): Payer: Self-pay | Admitting: Radiology

## 2021-03-07 DIAGNOSIS — Z1231 Encounter for screening mammogram for malignant neoplasm of breast: Secondary | ICD-10-CM | POA: Insufficient documentation

## 2021-03-14 NOTE — Progress Notes (Signed)
New Patient Office Visit  Subjective:  Patient ID: Kristin Matthews, female    DOB: 19-Jul-1970  Age: 51 y.o. MRN: [id number]  CC:  Chief Complaint  Patient presents with   Establish Care    Np est care. No main concerns.     HPI [name] presents for new patient visit to establish care.  Introduced to Publishing rights manager role and practice setting.  All questions answered.  Discussed provider/patient relationship and expectations.  She endorses that she has had menopause symptoms including hot flashes, fatigue, irregular menstrual cycles, and body aches. She had a menstrual period this month, however her prior one was 2 months ago. She scheduled an appointment with GYN soon to discuss these concerns further.   She has a history of prediabetes and has been trying to watch what she is eating. She has tried to limit her carbs and sugars, however she does have trouble at times sticking to this diet change. Over the past few weeks, she has noticed an increase in urinary frequency. Denies polyphagia and polydipsia. Her prior PCP was telling her that she is borderline diabetic.    HYPERTENSION  Hypertension status: stable  Satisfied with current treatment? yes Duration of hypertension: years BP monitoring frequency:  a few times a week BP range: 130s-140s BP medication side effects:  no Medication compliance: excellent compliance Previous BP meds: HCTZ  Aspirin: no Recurrent headaches: no Visual changes: no Palpitations: no Dyspnea: no Chest pain: no Lower extremity edema: no Dizzy/lightheaded: no  Past Medical History:  Diagnosis Date   Hyperlipidemia    Hypertension    Prediabetes     History reviewed. No pertinent surgical history.  Family History  Adopted: Yes    Social History   Socioeconomic History   Marital status: Single    Spouse name: Not on file   Number of children: Not on file   Years of education: Not on file   Highest education level: Not on file   Occupational History   Not on file  Tobacco Use   Smoking status: Never   Smokeless tobacco: Never  Vaping Use   Vaping Use: Never used  Substance and Sexual Activity   Alcohol use: Yes    Alcohol/week: 0.0 standard drinks    Comment: rare etoh   Drug use: No   Sexual activity: Yes    Birth control/protection: Condom  Other Topics Concern   Not on file  Social History Narrative   Not on file   Social Determinants of Health   Financial Resource Strain: Not on file  Food Insecurity: Not on file  Transportation Needs: Not on file  Physical Activity: Not on file  Stress: Not on file  Social Connections: Not on file  Intimate Partner Violence: Not on file    ROS Review of Systems  Constitutional:  Positive for fatigue.  HENT: Negative.    Eyes: Negative.   Respiratory: Negative.    Cardiovascular: Negative.   Gastrointestinal: Negative.   Endocrine: Positive for polyuria.  Genitourinary:  Positive for frequency.       Some hot flashes  Musculoskeletal: Negative.   Skin: Negative.   Neurological: Negative.   Psychiatric/Behavioral: Negative.     Objective:   Today's Vitals: BP (!) 144/90 (BP Location: Left Arm, Cuff Size: Large)    Pulse 71    Temp 98 F (36.7 C) (Oral)    Ht 5\' 1"  (1.549 m)    Wt 197 lb 6.4 oz (89.5 kg)  SpO2 98%    BMI 37.30 kg/m   Physical Exam Vitals and nursing note reviewed.  Constitutional:      General: She is not in acute distress.    Appearance: Normal appearance. She is obese.  HENT:     Head: Normocephalic and atraumatic.     Right Ear: Tympanic membrane, ear canal and external ear normal.     Left Ear: Tympanic membrane, ear canal and external ear normal.     Nose: Nose normal.     Mouth/Throat:     Mouth: Mucous membranes are moist.     Pharynx: Oropharynx is clear.  Eyes:     Conjunctiva/sclera: Conjunctivae normal.  Cardiovascular:     Rate and Rhythm: Normal rate and regular rhythm.     Pulses: Normal pulses.      Heart sounds: Normal heart sounds.  Pulmonary:     Effort: Pulmonary effort is normal.     Breath sounds: Normal breath sounds.  Abdominal:     General: Bowel sounds are normal.     Palpations: Abdomen is soft.     Tenderness: There is no abdominal tenderness.  Musculoskeletal:        General: Normal range of motion.     Cervical back: Normal range of motion. No tenderness.  Lymphadenopathy:     Cervical: No cervical adenopathy.  Skin:    General: Skin is warm and dry.  Neurological:     General: No focal deficit present.     Mental Status: She is alert and oriented to person, place, and time.     Cranial Nerves: No cranial nerve deficit.     Coordination: Coordination normal.     Gait: Gait normal.  Psychiatric:        Mood and Affect: Mood normal.        Behavior: Behavior normal.        Thought Content: Thought content normal.        Judgment: Judgment normal.    Assessment & Plan:   Problem List Items Addressed This Visit       Cardiovascular and Mediastinum   Primary hypertension    Chronic, blood pressure slightly elevated at today's visit at 144/90. Discussed limiting salt in her diet. She states that her blood pressure at home normally runs 130s-140s. Continue checking her blood pressure at home and write down the numbers. Consider ACE-I/ARB next visit if blood pressure still elevated. She has taken amlodipine in the past, however it caused swelling her ankles.       Relevant Medications   hydrochlorothiazide (HYDRODIURIL) 12.5 MG tablet     Other   Hyperlipidemia    Last cholesterol panel reviewed from 06/07/20. Her total cholesterol was 238, HDL 46, and LDL 182. She is not currently taking any medications. Her 10-year ASCVD risk score is 4.9%, however with high LDL and prediabetes, will consider medication in future visits. Will plan to have her come for fasting blood work next visit.       Relevant Medications   hydrochlorothiazide (HYDRODIURIL) 12.5 MG tablet    Prediabetes - Primary    A1C in office today was 6.4%, prior A1C on 12/27/20 was 6.3%. Discussed that she is borderline diabetic and she needs to continue limiting her carbs and sugars that she is eating. Follow up in 3 months.       Relevant Orders   POCT HgB A1C (Completed)   Obesity (BMI 30-39.9)    BMI 37.3. Discussed diet and exercise.  Goal is to lose 1-2 pounds per week.        Outpatient Encounter Medications as of 03/15/2021  Medication Sig   hydrochlorothiazide (HYDRODIURIL) 12.5 MG tablet Take 12.5 mg by mouth daily.   [DISCONTINUED] albuterol (PROVENTIL HFA;VENTOLIN HFA) 108 (90 BASE) MCG/ACT inhaler Inhale 2 puffs into the lungs every 6 (six) hours as needed for wheezing or shortness of breath.   [DISCONTINUED] amLODipine (NORVASC) 10 MG tablet amlodipine 10 mg tablet (Patient not taking: Reported on 03/15/2021)   [DISCONTINUED] benzonatate (TESSALON) 100 MG capsule Take 1-2 capsules (100-200 mg total) by mouth 3 (three) times daily as needed for cough.   No facility-administered encounter medications on file as of 03/15/2021.    Follow-up: Return in about 3 months (around 06/12/2021) for HTN, HLD.   Gerre Scull, NP

## 2021-03-15 ENCOUNTER — Other Ambulatory Visit: Payer: Self-pay

## 2021-03-15 ENCOUNTER — Encounter: Payer: Self-pay | Admitting: Nurse Practitioner

## 2021-03-15 ENCOUNTER — Ambulatory Visit (INDEPENDENT_AMBULATORY_CARE_PROVIDER_SITE_OTHER): Payer: 59 | Admitting: Nurse Practitioner

## 2021-03-15 VITALS — BP 144/90 | HR 71 | Temp 98.0°F | Ht 61.0 in | Wt 197.4 lb

## 2021-03-15 DIAGNOSIS — R7303 Prediabetes: Secondary | ICD-10-CM | POA: Diagnosis not present

## 2021-03-15 DIAGNOSIS — I10 Essential (primary) hypertension: Secondary | ICD-10-CM

## 2021-03-15 DIAGNOSIS — E669 Obesity, unspecified: Secondary | ICD-10-CM

## 2021-03-15 DIAGNOSIS — E785 Hyperlipidemia, unspecified: Secondary | ICD-10-CM | POA: Diagnosis not present

## 2021-03-15 LAB — POCT GLYCOSYLATED HEMOGLOBIN (HGB A1C)
HbA1c POC (<> result, manual entry): 6.4 % (ref 4.0–5.6)
HbA1c, POC (controlled diabetic range): 6.4 % (ref 0.0–7.0)
HbA1c, POC (prediabetic range): 6.4 % (ref 5.7–6.4)
Hemoglobin A1C: 6.4 % — AB (ref 4.0–5.6)

## 2021-03-15 NOTE — Assessment & Plan Note (Addendum)
Chronic, blood pressure slightly elevated at today's visit at 144/90. Discussed limiting salt in her diet. She states that her blood pressure at home normally runs 130s-140s. Continue checking her blood pressure at home and write down the numbers. Consider ACE-I/ARB next visit if blood pressure still elevated. She has taken amlodipine in the past, however it caused swelling her ankles.

## 2021-03-15 NOTE — Assessment & Plan Note (Addendum)
A1C in office today was 6.4%, prior A1C on 12/27/20 was 6.3%. Discussed that she is borderline diabetic and she needs to continue limiting her carbs and sugars that she is eating. Follow up in 3 months.

## 2021-03-15 NOTE — Assessment & Plan Note (Signed)
Last cholesterol panel reviewed from 06/07/20. Her total cholesterol was 238, HDL 46, and LDL 182. She is not currently taking any medications. Her 10-year ASCVD risk score is 4.9%, however with high LDL and prediabetes, will consider medication in future visits. Will plan to have her come for fasting blood work next visit.

## 2021-03-15 NOTE — Assessment & Plan Note (Signed)
BMI 37.3. Discussed diet and exercise. Goal is to lose 1-2 pounds per week.

## 2021-03-15 NOTE — Patient Instructions (Signed)
It was great to see you!  You can take ibuprofen or tylenol as needed for pain. Continue watching the amount of carbs, sugar, and salt you are eating. Keep checking your blood pressure a few times a week and write it down  Let's follow-up in 3 months, sooner if you have concerns.  If a referral was placed today, you will be contacted for an appointment. Please note that routine referrals can sometimes take up to 3-4 weeks to process. Please call our office if you haven't heard anything after this time frame.  Take care,  Rodman Pickle, NP

## 2021-04-11 ENCOUNTER — Other Ambulatory Visit (HOSPITAL_COMMUNITY)
Admission: RE | Admit: 2021-04-11 | Discharge: 2021-04-11 | Disposition: A | Discharge status: Home / Self Care | Payer: 59 | Source: Ambulatory Visit | Attending: Obstetrics & Gynecology | Admitting: Obstetrics & Gynecology

## 2021-04-11 ENCOUNTER — Ambulatory Visit (INDEPENDENT_AMBULATORY_CARE_PROVIDER_SITE_OTHER): Payer: 59 | Admitting: Obstetrics & Gynecology

## 2021-04-11 ENCOUNTER — Encounter: Payer: Self-pay | Admitting: Obstetrics & Gynecology

## 2021-04-11 ENCOUNTER — Other Ambulatory Visit: Payer: Self-pay

## 2021-04-11 VITALS — BP 154/99 | HR 75 | Ht 61.0 in | Wt 198.7 lb

## 2021-04-11 DIAGNOSIS — Z01419 Encounter for gynecological examination (general) (routine) without abnormal findings: Secondary | ICD-10-CM | POA: Diagnosis present

## 2021-04-11 DIAGNOSIS — N951 Menopausal and female climacteric states: Secondary | ICD-10-CM

## 2021-04-11 DIAGNOSIS — N898 Other specified noninflammatory disorders of vagina: Secondary | ICD-10-CM

## 2021-04-11 NOTE — Progress Notes (Signed)
Patient ID: Kristin Matthews, female   DOB: December 14, 1970, 51 y.o.   MRN: [id number] ? ?Chief Complaint  ?Patient presents with  ? Establish Care  ? ? ?HPI ?Kristin Matthews is a 51 y.o. female.  [id number] ?Patient's last menstrual period was 03/09/2021 (approximate). ?Pt in office to establish care and annual exam. Pt complains for itching, white discharge and odor in the vaginal area. Pt request STD testing today.  ?HPI ? ?Past Medical History:  ?Diagnosis Date  ? Hyperlipidemia   ? Hypertension   ? Prediabetes   ? ? ?History reviewed. No pertinent surgical history. ? ?Family History  ?Adopted: Yes  ? ? ?Social History ?Social History  ? ?Tobacco Use  ? Smoking status: Never  ? Smokeless tobacco: Never  ?Vaping Use  ? Vaping Use: Never used  ?Substance Use Topics  ? Alcohol use: Yes  ?  Comment: occ  ? Drug use: No  ? ? ?Allergies  ?Allergen Reactions  ? Amlodipine Swelling  ? Ferrous Gluconate Hives  ? ? ?Current Outpatient Medications  ?Medication Sig Dispense Refill  ? hydrochlorothiazide (HYDRODIURIL) 12.5 MG tablet Take 12.5 mg by mouth daily.    ? ?No current facility-administered medications for this visit.  ? ? ?Review of Systems ?Review of Systems  ?Constitutional: Negative.   ?Cardiovascular: Negative.   ?Gastrointestinal: Negative.   ?Genitourinary:  Positive for vaginal discharge and vaginal pain (irritation). Negative for menstrual problem, pelvic pain and vaginal bleeding.  ? ?Blood pressure (!) 154/99, pulse 75, height 5\' 1"  (1.549 m), weight 198 lb 11.2 oz (90.1 kg), last menstrual period 03/09/2021. ? ?Physical Exam ?Physical Exam ?Vitals and nursing note reviewed. Exam conducted with a chaperone present (mild vulvar erythema).  ?Constitutional:   ?   Appearance: Normal appearance.  ?Cardiovascular:  ?   Rate and Rhythm: Normal rate.  ?Pulmonary:  ?   Effort: Pulmonary effort is normal.  ?Genitourinary: ?   Vagina: Normal.  ?   Cervix: Normal.  ?   Uterus: Normal.   ?   Adnexa: Right adnexa normal and left adnexa  normal.  ?Musculoskeletal:     ?   General: Normal range of motion.  ?Skin: ?   General: Skin is warm and dry.  ?Neurological:  ?   General: No focal deficit present.  ?   Mental Status: She is alert.  ? ? ?Data Reviewed ?Office notes ? ?Assessment ?Well woman exam with routine gynecological exam - Plan: Cytology - PAP( [facility]), RPR, HepB+HepC+HIV Panel ? ?Perimenopause ? ?Vaginal itching - Plan: Cervicovaginal ancillary only( [place]) ? ? ?Plan ?F/u on lab results may have yeast vulvovaginitis ? ? ? ?05/07/2021 ?04/11/2021, 5:18 PM ? ? ? ?

## 2021-04-11 NOTE — Progress Notes (Signed)
Pt in office to establish care and annual exam. Pt complains for itching, white discharge and odor in the vaginal area. Pt request STD testing today.  ?Last mammogram:  ?

## 2021-04-12 ENCOUNTER — Other Ambulatory Visit: Payer: Self-pay | Admitting: Obstetrics & Gynecology

## 2021-04-12 DIAGNOSIS — N898 Other specified noninflammatory disorders of vagina: Secondary | ICD-10-CM

## 2021-04-12 LAB — CERVICOVAGINAL ANCILLARY ONLY
Bacterial Vaginitis (gardnerella): POSITIVE — AB
Candida Glabrata: NEGATIVE
Candida Vaginitis: NEGATIVE
Chlamydia: NEGATIVE
Comment: NEGATIVE
Comment: NEGATIVE
Comment: NEGATIVE
Comment: NEGATIVE
Comment: NEGATIVE
Comment: NORMAL
Neisseria Gonorrhea: NEGATIVE
Trichomonas: NEGATIVE

## 2021-04-12 MED ORDER — METRONIDAZOLE 500 MG PO TABS
500.0000 mg | ORAL_TABLET | Freq: Two times a day (BID) | ORAL | 0 refills | Status: DC
Start: 2021-04-12 — End: 2021-06-13

## 2021-04-14 LAB — CYTOLOGY - PAP
Comment: NEGATIVE
Diagnosis: NEGATIVE
Diagnosis: REACTIVE
High risk HPV: NEGATIVE

## 2021-04-29 LAB — HM DIABETES EYE EXAM

## 2021-06-12 NOTE — Progress Notes (Signed)
? ?Established Patient Office Visit ? ?Subjective   ?Patient ID: Kristin Matthews, female    DOB: [birth year]  Age: 51 y.o. MRN: [id number] ? ?Chief Complaint  ?Patient presents with  ? Follow-up  ?  3 mo f/u. Pt is not fasting  ? ? ?HPI ? ?Kristin Matthews is here today to follow-up on hypertension and prediabetes.  ? ?She has been checking her blood pressure at home and it has been running 130s-140s.  She is still taking hydrochlorothiazide 12.5 mg daily.  She denies chest pain and shortness of breath.  ? ?She was recently diagnosed with BV. She took and finished a full course of flagyl. She is still having ongoing vaginal itching. She denies vaginal discharge, odor, and dysuria.  ? ?She has been having a runny nose and itchy throat for the last 2 weeks. She also endorses coughing and itchy eyes. She denies fevers, ear pain. She has not taken any medications over the counter to help her symptoms. She wants to make sure she doesn't have the flu.  ? ?ROS ?See pertinent positives and negatives per HPI. ? ?  ?Objective:  ?  ? ?BP 120/80 (BP Location: Right Arm, Cuff Size: Normal)   Pulse 73   Temp (!) 97.1 ?F (36.2 ?C) (Temporal)   Wt 200 lb 9.6 oz (91 kg)   SpO2 96%   BMI 37.90 kg/m?  ? ? ?Physical Exam ?Vitals and nursing note reviewed.  ?Constitutional:   ?   General: She is not in acute distress. ?   Appearance: Normal appearance.  ?HENT:  ?   Head: Normocephalic.  ?   Right Ear: Tympanic membrane, ear canal and external ear normal.  ?   Left Ear: Tympanic membrane, ear canal and external ear normal.  ?   Mouth/Throat:  ?   Mouth: Mucous membranes are moist.  ?   Pharynx: Oropharynx is clear. No oropharyngeal exudate or posterior oropharyngeal erythema.  ?Eyes:  ?   Conjunctiva/sclera: Conjunctivae normal.  ?Cardiovascular:  ?   Rate and Rhythm: Normal rate and regular rhythm.  ?   Pulses: Normal pulses.  ?   Heart sounds: Normal heart sounds.  ?Pulmonary:  ?   Effort: Pulmonary effort is normal.  ?   Breath sounds: Normal breath  sounds.  ?Musculoskeletal:  ?   Cervical back: Normal range of motion.  ?Skin: ?   General: Skin is warm.  ?Neurological:  ?   General: No focal deficit present.  ?   Mental Status: She is alert and oriented to person, place, and time.  ?Psychiatric:     ?   Mood and Affect: Mood normal.     ?   Behavior: Behavior normal.     ?   Thought Content: Thought content normal.     ?   Judgment: Judgment normal.  ? ? ? ?Results for orders placed or performed in visit on 06/13/21  ?POC COVID-19 BinaxNow  ?Result Value Ref Range  ? SARS Coronavirus 2 Ag Negative Negative  ?POCT Influenza A/B  ?Result Value Ref Range  ? Influenza A, POC Negative Negative  ? Influenza B, POC Negative Negative  ?HM COLONOSCOPY  ?Result Value Ref Range  ? HM Colonoscopy See Report (in chart) See Report (in chart), Patient Reported  ? ? ? ? ?The 10-year ASCVD risk score (Arnett DK, et al., 2019) is: 3.4% ? ?  ?Assessment & Plan:  ? ?Problem List Items Addressed This Visit   ? ?  ? Cardiovascular and  Mediastinum  ? Primary hypertension - Primary  ?  Chronic, stable.  Her blood pressure today is 120/80.  Continue hydrochlorothiazide 12.5 mg daily.  Follow-up in 6 months. ? ?  ?  ?  ? Respiratory  ? Seasonal allergic rhinitis  ?  Symptoms consistent with allergies for the last 2 weeks.  She would still like testing for the flu and COVID, which was negative in office today.  She can start either loratadine or cetirizine 10 mg daily to help her symptoms.  Encouraged her to drink plenty of fluids.  Follow-up if symptoms worsen or do not improve. ? ?  ?  ? Relevant Orders  ? POC COVID-19 BinaxNow (Completed)  ? POCT Influenza A/B (Completed)  ?  ? Other  ? Hyperlipidemia  ?  Chronic, ongoing.  Cholesterol was elevated last visit with her LDL at 182.  We will recheck lipid panel today.  Treat based on results. ? ?  ?  ? Relevant Orders  ? Lipid panel  ? Prediabetes  ?  Her A1c was 6.4% last visit 3 months ago.  We will check A1c today and treat based on  results. ? ?  ?  ? Relevant Orders  ? Hemoglobin A1c  ? ?Other Visit Diagnoses   ? ? Vaginal itching      ? We will check urine for BV, yeast, trichomoniasis.  She was recently treated with Flagyl for BV.  We will also refill her estradiol cream to use twice a week.  ? Relevant Orders  ? Urine cytology ancillary only  ? ?  ? ? ?Return in about 6 months (around 12/14/2021) for CPE.  ? ? ?Gerre Scull, NP ? ?

## 2021-06-13 ENCOUNTER — Ambulatory Visit (INDEPENDENT_AMBULATORY_CARE_PROVIDER_SITE_OTHER): Payer: 59 | Admitting: Nurse Practitioner

## 2021-06-13 ENCOUNTER — Other Ambulatory Visit (HOSPITAL_COMMUNITY)
Admission: RE | Admit: 2021-06-13 | Discharge: 2021-06-13 | Disposition: A | Discharge status: Home / Self Care | Payer: 59 | Source: Ambulatory Visit | Attending: Nurse Practitioner | Admitting: Nurse Practitioner

## 2021-06-13 ENCOUNTER — Encounter: Payer: Self-pay | Admitting: Nurse Practitioner

## 2021-06-13 VITALS — BP 120/80 | HR 73 | Temp 97.1°F | Wt 200.6 lb

## 2021-06-13 DIAGNOSIS — J302 Other seasonal allergic rhinitis: Secondary | ICD-10-CM | POA: Diagnosis not present

## 2021-06-13 DIAGNOSIS — R7303 Prediabetes: Secondary | ICD-10-CM | POA: Diagnosis not present

## 2021-06-13 DIAGNOSIS — N898 Other specified noninflammatory disorders of vagina: Secondary | ICD-10-CM

## 2021-06-13 DIAGNOSIS — E785 Hyperlipidemia, unspecified: Secondary | ICD-10-CM

## 2021-06-13 DIAGNOSIS — I10 Essential (primary) hypertension: Secondary | ICD-10-CM

## 2021-06-13 LAB — LIPID PANEL
Cholesterol: 222 mg/dL — ABNORMAL HIGH (ref 0–200)
HDL: 45.3 mg/dL (ref >39.00)
LDL Cholesterol: 162 mg/dL — ABNORMAL HIGH (ref 0–99)
NonHDL: 176.22
Total CHOL/HDL Ratio: 5
Triglycerides: 72 mg/dL (ref 0.0–149.0)
VLDL: 14.4 mg/dL (ref 0.0–40.0)

## 2021-06-13 LAB — POCT INFLUENZA A/B
Influenza A, POC: NEGATIVE
Influenza B, POC: NEGATIVE

## 2021-06-13 LAB — POC COVID19 BINAXNOW: SARS Coronavirus 2 Ag: NEGATIVE

## 2021-06-13 LAB — HEMOGLOBIN A1C: Hgb A1c MFr Bld: 6.6 % — ABNORMAL HIGH (ref 4.6–6.5)

## 2021-06-13 MED ORDER — ESTRADIOL 0.1 MG/GM VA CREA: 1.0000 | TOPICAL_CREAM | VAGINAL | 0 refills | Status: AC

## 2021-06-13 NOTE — Assessment & Plan Note (Addendum)
Chronic, ongoing.  Cholesterol was elevated last visit with her LDL at 182.  We will recheck lipid panel today.  Treat based on results. ?

## 2021-06-13 NOTE — Assessment & Plan Note (Signed)
Symptoms consistent with allergies for the last 2 weeks.  She would still like testing for the flu and COVID, which was negative in office today.  She can start either loratadine or cetirizine 10 mg daily to help her symptoms.  Encouraged her to drink plenty of fluids.  Follow-up if symptoms worsen or do not improve. ?

## 2021-06-13 NOTE — Assessment & Plan Note (Signed)
Chronic, stable.  Her blood pressure today is 120/80.  Continue hydrochlorothiazide 12.5 mg daily.  Follow-up in 6 months. ?

## 2021-06-13 NOTE — Patient Instructions (Signed)
It was great to see you! ? ?Your symptoms are most consistent with allergies. I recommend starting claritin or zyrtec daily. You can get the generic or store brand loratadine or cetirizine.  ? ?We are checking your labs today and will let you know the results via mychart/phone.  ? ?Let's follow-up in 6 months, sooner if you have concerns. ? ?If a referral was placed today, you will be contacted for an appointment. Please note that routine referrals can sometimes take up to 3-4 weeks to process. Please call our office if you haven't heard anything after this time frame. ? ?Take care, ? ?Rodman Pickle, NP ? ?

## 2021-06-13 NOTE — Assessment & Plan Note (Signed)
Her A1c was 6.4% last visit 3 months ago.  We will check A1c today and treat based on results. ?

## 2021-06-14 LAB — URINE CYTOLOGY ANCILLARY ONLY
Bacterial Vaginitis-Urine: NEGATIVE
Candida Urine: NEGATIVE
Comment: NEGATIVE
Trichomonas: NEGATIVE

## 2021-06-15 NOTE — Addendum Note (Signed)
Addended by: Rodman Pickle A on: 06/15/2021 08:11 AM   Modules accepted: Orders

## 2021-06-19 NOTE — Progress Notes (Signed)
Called and informed patient of results and provider instructions. Patient voiced understanding. Sw, cma

## 2021-06-20 ENCOUNTER — Other Ambulatory Visit (INDEPENDENT_AMBULATORY_CARE_PROVIDER_SITE_OTHER): Payer: 59

## 2021-06-20 DIAGNOSIS — R7303 Prediabetes: Secondary | ICD-10-CM | POA: Diagnosis not present

## 2021-06-20 LAB — BASIC METABOLIC PANEL
BUN: 11 mg/dL (ref 6–23)
CO2: 29 mEq/L (ref 19–32)
Calcium: 9.3 mg/dL (ref 8.4–10.5)
Chloride: 102 mEq/L (ref 96–112)
Creatinine, Ser: 0.66 mg/dL (ref 0.40–1.20)
GFR: 101.62 mL/min (ref >60.00)
Glucose, Bld: 137 mg/dL — ABNORMAL HIGH (ref 70–99)
Potassium: 3.8 mEq/L (ref 3.5–5.1)
Sodium: 139 mEq/L (ref 135–145)

## 2021-06-20 NOTE — Progress Notes (Signed)
Per the orders of  Rodman Pickle pt is here for labs, pt tolerated draw well.

## 2021-06-21 MED ORDER — METFORMIN HCL ER 500 MG PO TB24: 500.0000 mg | ORAL_TABLET | Freq: Every day | ORAL | 2 refills | Status: DC

## 2021-06-21 NOTE — Addendum Note (Signed)
Addended by: Vance Peper A on: 06/21/2021 11:55 AM   Modules accepted: Orders

## 2021-06-23 NOTE — Progress Notes (Signed)
Called and informed patient of results and provider instructions. Patient voiced understanding. Pt scheduled for f/u visit regarding sugar levels.

## 2021-06-27 NOTE — Progress Notes (Unsigned)
   Established Patient Office Visit  Subjective   Patient ID: Kristin Matthews, female    DOB: [birth year]  Age: 51 y.o. MRN: [id number]  No chief complaint on file.   HPI  Kristin Matthews is here today to discuss recent diagnosis of diabetes. She was started on metformin XR 500mg  daily.   {History (Optional):23778}  ROS    Objective:     There were no vitals taken for this visit. {Vitals History (Optional):23777}  Physical Exam   No results found for any visits on 06/28/21.  {Labs (Optional):23779}  The 10-year ASCVD risk score (Arnett DK, et al., 2019) is: 2.3%    Assessment & Plan:   Problem List Items Addressed This Visit   None   No follow-ups on file.    2020, NP

## 2021-06-28 ENCOUNTER — Ambulatory Visit (INDEPENDENT_AMBULATORY_CARE_PROVIDER_SITE_OTHER): Payer: 59 | Admitting: Nurse Practitioner

## 2021-06-28 ENCOUNTER — Encounter: Payer: Self-pay | Admitting: Nurse Practitioner

## 2021-06-28 VITALS — BP 122/82 | HR 62 | Temp 97.5°F | Wt 199.6 lb

## 2021-06-28 DIAGNOSIS — E119 Type 2 diabetes mellitus without complications: Secondary | ICD-10-CM | POA: Diagnosis not present

## 2021-06-28 DIAGNOSIS — K649 Unspecified hemorrhoids: Secondary | ICD-10-CM | POA: Diagnosis not present

## 2021-06-28 MED ORDER — ROSUVASTATIN CALCIUM 20 MG PO TABS: 20.0000 mg | ORAL_TABLET | Freq: Every day | ORAL | 2 refills | Status: DC

## 2021-06-28 MED ORDER — HYDROCORTISONE ACETATE 25 MG RE SUPP: 25.0000 mg | Freq: Two times a day (BID) | RECTAL | 0 refills | Status: AC

## 2021-06-28 MED ORDER — POLYETHYLENE GLYCOL 3350 17 GM/SCOOP PO POWD: 17.0000 g | Freq: Every day | ORAL | 1 refills | Status: AC | PRN

## 2021-06-28 NOTE — Patient Instructions (Addendum)
It was great to see you!  Start annusol suppository in your rectum twice a day for 6 days to help with your hemorrhoid. Start miralax one scoop daily with water, coffee, or juice to help with your constipation. Make sure you are drinking plenty of water.  Start rosuvastatin once a day to help protect you from heart attack and stroke.  I recommend a yearly eye exam.   Continue metformin once a day.   Let's follow-up in 3 months, sooner if you have concerns.  If a referral was placed today, you will be contacted for an appointment. Please note that routine referrals can sometimes take up to 3-4 weeks to process. Please call our office if you haven't heard anything after this time frame.  Take care,  Rodman Pickle, NP

## 2021-06-28 NOTE — Assessment & Plan Note (Signed)
Her last A1c was noted to be 6.6% and her fasting blood sugar was 137.  She was started on metformin XR 500 mg daily last visit.  She is tolerating this well.  We will start her on rosuvastatin 20 mg daily for cardiac and stroke protection.  Discussed getting a yearly eye exam, looking at her feet routinely.  We will have her follow-up in 3 months and repeat A1c, lipid panel, and check urine microalbumin at that point.  Discussed diet and nutrition with limiting the amount of rice that she is eating and making sure that she is exercising.  Follow-up in 3 months or sooner with concerns.

## 2021-06-28 NOTE — Assessment & Plan Note (Signed)
External hemorrhoid noted on exam.  We will start Anusol suppository twice a day for 6 days.  Also discussed that this can be irritated from constipation.  Encouraged her to start drinking plenty of fluids and will start MiraLAX daily as needed.  She can also do sitz bath's to help with the itching or using witch hazel wipes.  Follow-up if symptoms do not improve or with any concerns.

## 2021-09-19 ENCOUNTER — Telehealth: Payer: Self-pay | Admitting: Nurse Practitioner

## 2021-09-19 DIAGNOSIS — R7303 Prediabetes: Secondary | ICD-10-CM

## 2021-09-19 MED ORDER — HYDROCHLOROTHIAZIDE 12.5 MG PO TABS: 12.5000 mg | ORAL_TABLET | Freq: Every day | ORAL | 2 refills | Status: DC

## 2021-09-19 NOTE — Telephone Encounter (Signed)
Caller Name: Catina Nuss Call back phone #: [phone]  Reason for Call: Pt stated that she needs metformin, hydrochlorothiazide, rosuvastatin filled by Pharmacy   [facility] [id number] [address], [place] - [address] AT [facility] & [address] Phone:  [phone]  Fax:  [phone]

## 2021-09-19 NOTE — Telephone Encounter (Signed)
Requested medication has been approved and sent to patient's preferred pharmacy. Please follow-up with pharmacy to check for additional refills.   

## 2021-09-26 NOTE — Progress Notes (Unsigned)
   Established Patient Office Visit  Subjective   Patient ID: Kristin Matthews, female    DOB: 12-Feb-1970  Age: 51 y.o. MRN: [id number]  No chief complaint on file.   HPI  Montina Dorrance is here to follow-up on diabetes and hyperlipidemia.   DIABETES  Hypoglycemic episodes:{Blank single:19197::"yes","no"} Polydipsia/polyuria: {Blank single:19197::"yes","no"} Visual disturbance: {Blank single:19197::"yes","no"} Chest pain: {Blank single:19197::"yes","no"} Paresthesias: {Blank single:19197::"yes","no"} Glucose Monitoring: {Blank single:19197::"yes","no"}  Accucheck frequency: {Blank single:19197::"Not Checking","Daily","BID","TID"}  Fasting glucose:  Post prandial:  Evening:  Before meals: Taking Insulin?: no  Long acting insulin:  Short acting insulin: Blood Pressure Monitoring: {Blank single:19197::"not checking","rarely","daily","weekly","monthly","a few times a day","a few times a week","a few times a month"} Retinal Examination: {Blank single:19197::"Up to Date","Not up to Date"} Foot Exam: {Blank single:19197::"Up to Date","Not up to Date"} Diabetic Education: {Blank single:19197::"Completed","Not Completed"} Pneumovax: {Blank single:19197::"Up to Date","Not up to Date","unknown"} Influenza: {Blank single:19197::"Up to Date","Not up to Date","unknown"}   {History (Optional):23778}  ROS    Objective:     There were no vitals taken for this visit. {Vitals History (Optional):23777}  Physical Exam   No results found for any visits on 09/27/21.  {Labs (Optional):23779}  The 10-year ASCVD risk score (Arnett DK, et al., 2019) is: 4.6%    Assessment & Plan:   Problem List Items Addressed This Visit   None   No follow-ups on file.    Gerre Scull, NP

## 2021-09-27 ENCOUNTER — Ambulatory Visit (INDEPENDENT_AMBULATORY_CARE_PROVIDER_SITE_OTHER): Payer: 59 | Admitting: Nurse Practitioner

## 2021-09-27 ENCOUNTER — Encounter: Payer: Self-pay | Admitting: Nurse Practitioner

## 2021-09-27 VITALS — BP 135/83 | HR 72 | Temp 97.1°F | Wt 202.4 lb

## 2021-09-27 DIAGNOSIS — Z23 Encounter for immunization: Secondary | ICD-10-CM

## 2021-09-27 DIAGNOSIS — M722 Plantar fascial fibromatosis: Secondary | ICD-10-CM | POA: Diagnosis not present

## 2021-09-27 DIAGNOSIS — M5441 Lumbago with sciatica, right side: Secondary | ICD-10-CM | POA: Diagnosis not present

## 2021-09-27 DIAGNOSIS — E119 Type 2 diabetes mellitus without complications: Secondary | ICD-10-CM | POA: Diagnosis not present

## 2021-09-27 DIAGNOSIS — Z1159 Encounter for screening for other viral diseases: Secondary | ICD-10-CM

## 2021-09-27 DIAGNOSIS — E785 Hyperlipidemia, unspecified: Secondary | ICD-10-CM | POA: Diagnosis not present

## 2021-09-27 LAB — LIPID PANEL
Cholesterol: 138 mg/dL (ref 0–200)
HDL: 49.2 mg/dL (ref >39.00)
LDL Cholesterol: 75 mg/dL (ref 0–99)
NonHDL: 88.89
Total CHOL/HDL Ratio: 3
Triglycerides: 69 mg/dL (ref 0.0–149.0)
VLDL: 13.8 mg/dL (ref 0.0–40.0)

## 2021-09-27 LAB — COMPREHENSIVE METABOLIC PANEL
ALT: 15 U/L (ref 0–35)
AST: 16 U/L (ref 0–37)
Albumin: 4.2 g/dL (ref 3.5–5.2)
Alkaline Phosphatase: 67 U/L (ref 39–117)
BUN: 13 mg/dL (ref 6–23)
CO2: 28 mEq/L (ref 19–32)
Calcium: 9.1 mg/dL (ref 8.4–10.5)
Chloride: 103 mEq/L (ref 96–112)
Creatinine, Ser: 0.59 mg/dL (ref 0.40–1.20)
GFR: 104.21 mL/min (ref >60.00)
Glucose, Bld: 121 mg/dL — ABNORMAL HIGH (ref 70–99)
Potassium: 3.8 mEq/L (ref 3.5–5.1)
Sodium: 138 mEq/L (ref 135–145)
Total Bilirubin: 0.4 mg/dL (ref 0.2–1.2)
Total Protein: 7.5 g/dL (ref 6.0–8.3)

## 2021-09-27 LAB — MICROALBUMIN / CREATININE URINE RATIO
Creatinine,U: 25.8 mg/dL
Microalb Creat Ratio: 12.5 mg/g (ref 0.0–30.0)
Microalb, Ur: 3.2 mg/dL — ABNORMAL HIGH (ref 0.0–1.9)

## 2021-09-27 LAB — HEMOGLOBIN A1C: Hgb A1c MFr Bld: 6.5 % (ref 4.6–6.5)

## 2021-09-27 MED ORDER — LISINOPRIL 10 MG PO TABS: 10.0000 mg | ORAL_TABLET | Freq: Every day | ORAL | 2 refills | Status: DC

## 2021-09-27 MED ORDER — KETOCONAZOLE 2 % EX CREA
1.0000 | TOPICAL_CREAM | Freq: Every day | CUTANEOUS | 0 refills | Status: AC | PRN
Start: 2021-09-27 — End: ?

## 2021-09-27 MED ORDER — PREDNISONE 10 MG PO TABS: ORAL_TABLET | ORAL | 0 refills | Status: DC

## 2021-09-27 NOTE — Assessment & Plan Note (Signed)
Symptoms consistent with bilateral plantar fasciitis. Will have her start a prednisone taper to help with symptoms. Discussed stretching and using ice several times per day. Follow-up in 4 weeks, sooner with concerns.

## 2021-09-27 NOTE — Assessment & Plan Note (Signed)
Acute pain for the past 3 weeks. No red flags on exam. Will have her start a prednisone taper and start back stretches daily. She can also take ibuprofen/tylenol, use ice or heat. Follow-up in 4 weeks.

## 2021-09-27 NOTE — Assessment & Plan Note (Signed)
She has not been checking her blood sugars at home. Will order a kit so she can start checking her blood sugars in the morning. She is tolerating her medications. Continue metformin XR 542m daily and rosuvastatin 25mdaily. Check CMP, CBC, A1c, urine microalbumin, and lipid panel today. Discussed getting an eye exam yearly. Follow-up in 3 months.

## 2021-09-27 NOTE — Addendum Note (Signed)
Addended by: Rodman Pickle A on: 09/27/2021 10:37 PM   Modules accepted: Orders

## 2021-09-27 NOTE — Assessment & Plan Note (Signed)
Check lipid panel today and adjust rosuvastatin based on results.

## 2021-09-27 NOTE — Patient Instructions (Addendum)
It was great to see you!  We are checking your labs today and will let you know the results via mychart/phone.   I recommend that you go yearly to the eye doctor.   Start stretches daily for your back and foot, I have attached these.   Use ice on your heels a few times a day for about 10-15 minutes at a time.   Start prednisone 6 tablets today, then 5 tablets tomorrow, then 4 tablets the next day, 3 tablets, 2 tablets, then 1 tablet. Take this with food and in the morning.   Let's follow-up in 4 weeks, sooner if you have concerns.  If a referral was placed today, you will be contacted for an appointment. Please note that routine referrals can sometimes take up to 3-4 weeks to process. Please call our office if you haven't heard anything after this time frame.  Take care,  Rodman Pickle, NP

## 2021-09-28 LAB — HEPATITIS C ANTIBODY: Hepatitis C Ab: NONREACTIVE

## 2021-10-25 ENCOUNTER — Ambulatory Visit (INDEPENDENT_AMBULATORY_CARE_PROVIDER_SITE_OTHER): Payer: 59 | Admitting: Nurse Practitioner

## 2021-10-25 ENCOUNTER — Encounter: Payer: Self-pay | Admitting: Nurse Practitioner

## 2021-10-25 VITALS — BP 128/88 | HR 59 | Temp 96.8°F | Wt 198.0 lb

## 2021-10-25 DIAGNOSIS — M722 Plantar fascial fibromatosis: Secondary | ICD-10-CM | POA: Diagnosis not present

## 2021-10-25 DIAGNOSIS — E119 Type 2 diabetes mellitus without complications: Secondary | ICD-10-CM | POA: Diagnosis not present

## 2021-10-25 MED ORDER — ROSUVASTATIN CALCIUM 20 MG PO TABS
ORAL_TABLET | ORAL | 1 refills | Status: DC
Start: 2021-10-25 — End: 2022-06-11

## 2021-10-25 MED ORDER — METFORMIN HCL ER 500 MG PO TB24: ORAL_TABLET | ORAL | 1 refills | Status: DC

## 2021-10-25 MED ORDER — HYDROCHLOROTHIAZIDE 12.5 MG PO TABS: 12.5000 mg | ORAL_TABLET | Freq: Every day | ORAL | 1 refills | Status: AC

## 2021-10-25 MED ORDER — LISINOPRIL 10 MG PO TABS: 10.0000 mg | ORAL_TABLET | Freq: Every day | ORAL | 1 refills | Status: AC

## 2021-10-25 NOTE — Assessment & Plan Note (Signed)
Metformin refill sent to the pharmacy.

## 2021-10-25 NOTE — Patient Instructions (Signed)
It was great to see you!  I have placed a referral to podiatry for your ongoing foot pain. Start the attached stretches daily. You can also use ice on your foot several times per day.   Let's follow-up at your next scheduled appointment.  If a referral was placed today, you will be contacted for an appointment. Please note that routine referrals can sometimes take up to 3-4 weeks to process. Please call our office if you haven't heard anything after this time frame.  Take care,  Vance Peper, NP

## 2021-10-25 NOTE — Progress Notes (Signed)
   Established Patient Office Visit  Subjective   Patient ID: Kristin Matthews, female    DOB: 08/26/1970  Age: 51 y.o. MRN: [id number]  Chief Complaint  Patient presents with   Follow-up    4 wk f/u back pain, heel pain. Pt states back pain and heel pain has subsided some but still experiences heel pain when standing long periods of time    HPI  Kristin Matthews is here to follow-up on back pain and plantar fasciitis.  She states that her back pain has gotten better, however her heel pain is ongoing.  She states that the pain is in both feet and hurts worse when she first gets up in the morning.  After she walks around for a little bit the pain gets better and goes away.  She has not done any stretches.  She finished a prednisone taper.    ROS See pertinent positives and negatives per HPI.    Objective:     BP 128/88   Pulse (!) 59   Temp (!) 96.8 F (36 C) (Temporal)   Wt 198 lb (89.8 kg)   SpO2 99%   BMI 37.41 kg/m    Physical Exam Vitals and nursing note reviewed.  Constitutional:      General: She is not in acute distress.    Appearance: Normal appearance.  HENT:     Head: Normocephalic.  Eyes:     Conjunctiva/sclera: Conjunctivae normal.  Pulmonary:     Effort: Pulmonary effort is normal.  Musculoskeletal:        General: No swelling or tenderness. Normal range of motion.     Cervical back: Normal range of motion.  Skin:    General: Skin is warm.  Neurological:     General: No focal deficit present.     Mental Status: She is alert and oriented to person, place, and time.  Psychiatric:        Mood and Affect: Mood normal.        Behavior: Behavior normal.        Thought Content: Thought content normal.        Judgment: Judgment normal.      Assessment & Plan:   Problem List Items Addressed This Visit       Endocrine   Diabetes mellitus without complication ([place])    Metformin refill sent to the pharmacy.      Relevant Medications   lisinopril (ZESTRIL) 10  MG tablet   rosuvastatin (CRESTOR) 20 MG tablet   metFORMIN (GLUCOPHAGE-XR) 500 MG 24 hr tablet     Musculoskeletal and Integument   Plantar fasciitis - Primary    She is having ongoing pain in both feet, that especially worse in the mornings.  She has taken a prednisone taper which did not fully help her symptoms.  She can continue taking ibuprofen or Tylenol as needed.  Stretches printed and given to her to do daily.  She was also encouraged to use ice several times a day to help with the pain.  With ongoing symptoms, will place referral to podiatry.      Relevant Orders   Ambulatory referral to Podiatry    Return if symptoms worsen or fail to improve.    Charyl Dancer, NP

## 2021-10-25 NOTE — Assessment & Plan Note (Signed)
She is having ongoing pain in both feet, that especially worse in the mornings.  She has taken a prednisone taper which did not fully help her symptoms.  She can continue taking ibuprofen or Tylenol as needed.  Stretches printed and given to her to do daily.  She was also encouraged to use ice several times a day to help with the pain.  With ongoing symptoms, will place referral to podiatry.

## 2021-11-06 ENCOUNTER — Encounter: Payer: 59 | Admitting: Podiatry

## 2021-11-09 NOTE — Progress Notes (Signed)
No show

## 2021-11-16 ENCOUNTER — Ambulatory Visit: Payer: Commercial Managed Care - HMO | Admitting: Podiatry

## 2021-11-16 ENCOUNTER — Ambulatory Visit (INDEPENDENT_AMBULATORY_CARE_PROVIDER_SITE_OTHER): Payer: Commercial Managed Care - HMO

## 2021-11-16 DIAGNOSIS — M79671 Pain in right foot: Secondary | ICD-10-CM

## 2021-11-16 DIAGNOSIS — M722 Plantar fascial fibromatosis: Secondary | ICD-10-CM | POA: Diagnosis not present

## 2021-11-16 DIAGNOSIS — M216X1 Other acquired deformities of right foot: Secondary | ICD-10-CM | POA: Diagnosis not present

## 2021-11-16 DIAGNOSIS — M7732 Calcaneal spur, left foot: Secondary | ICD-10-CM | POA: Diagnosis not present

## 2021-11-16 DIAGNOSIS — M7731 Calcaneal spur, right foot: Secondary | ICD-10-CM

## 2021-11-16 DIAGNOSIS — M79672 Pain in left foot: Secondary | ICD-10-CM

## 2021-11-16 DIAGNOSIS — M216X2 Other acquired deformities of left foot: Secondary | ICD-10-CM

## 2021-11-16 MED ORDER — MELOXICAM 7.5 MG PO TABS: 7.5000 mg | ORAL_TABLET | Freq: Every day | ORAL | 0 refills | Status: DC | PRN

## 2021-11-16 NOTE — Patient Instructions (Signed)
If was nice to meet you today. If you have any questions or any further concerns, please feel fee to give me a call. You can call our office at [phone] or please feel fee to send me a message through MyChart.   ----  For instructions on how to put on your Plantar Fascial Brace, please visit [email or web address]  ---     Plantar Fasciitis (Heel Spur Syndrome) with Rehab The plantar fascia is a fibrous, ligament-like, soft-tissue structure that spans the bottom of the foot. Plantar fasciitis is a condition that causes pain in the foot due to inflammation of the tissue. SYMPTOMS  Pain and tenderness on the underneath side of the foot. Pain that worsens with standing or walking. CAUSES  Plantar fasciitis is caused by irritation and injury to the plantar fascia on the underneath side of the foot. Common mechanisms of injury include: Direct trauma to bottom of the foot. Damage to a small nerve that runs under the foot where the main fascia attaches to the heel bone. Stress placed on the plantar fascia due to bone spurs. RISK INCREASES WITH:  Activities that place stress on the plantar fascia (running, jumping, pivoting, or cutting). Poor strength and flexibility. Improperly fitted shoes. Tight calf muscles. Flat feet. Failure to warm-up properly before activity. Obesity. PREVENTION Warm up and stretch properly before activity. Allow for adequate recovery between workouts. Maintain physical fitness: Strength, flexibility, and endurance. Cardiovascular fitness. Maintain a health body weight. Avoid stress on the plantar fascia. Wear properly fitted shoes, including arch supports for individuals who have flat feet.  PROGNOSIS  If treated properly, then the symptoms of plantar fasciitis usually resolve without surgery. However, occasionally surgery is necessary.  RELATED COMPLICATIONS  Recurrent symptoms that may result in a chronic condition. Problems of the lower back  that are caused by compensating for the injury, such as limping. Pain or weakness of the foot during push-off following surgery. Chronic inflammation, scarring, and partial or complete fascia tear, occurring more often from repeated injections.  TREATMENT  Treatment initially involves the use of ice and medication to help reduce pain and inflammation. The use of strengthening and stretching exercises may help reduce pain with activity, especially stretches of the Achilles tendon. These exercises may be performed at home or with a therapist. Your caregiver may recommend that you use heel cups of arch supports to help reduce stress on the plantar fascia. Occasionally, corticosteroid injections are given to reduce inflammation. If symptoms persist for greater than 6 months despite non-surgical (conservative), then surgery may be recommended.   MEDICATION  If pain medication is necessary, then nonsteroidal anti-inflammatory medications, such as aspirin and ibuprofen, or other minor pain relievers, such as acetaminophen, are often recommended. Do not take pain medication within 7 days before surgery. Prescription pain relievers may be given if deemed necessary by your caregiver. Use only as directed and only as much as you need. Corticosteroid injections may be given by your caregiver. These injections should be reserved for the most serious cases, because they may only be given a certain number of times.  HEAT AND COLD Cold treatment (icing) relieves pain and reduces inflammation. Cold treatment should be applied for 10 to 15 minutes every 2 to 3 hours for inflammation and pain and immediately after any activity that aggravates your symptoms. Use ice packs or massage the area with a piece of ice (ice massage). Heat treatment may be used prior to performing the stretching and strengthening activities prescribed by your caregiver,   physical therapist, or athletic trainer. Use a heat pack or soak the injury  in warm water.  SEEK IMMEDIATE MEDICAL CARE IF: Treatment seems to offer no benefit, or the condition worsens. Any medications produce adverse side effects.  EXERCISES- RANGE OF MOTION (ROM) AND STRETCHING EXERCISES - Plantar Fasciitis (Heel Spur Syndrome) These exercises may help you when beginning to rehabilitate your injury. Your symptoms may resolve with or without further involvement from your physician, physical therapist or athletic trainer. While completing these exercises, remember:  Restoring tissue flexibility helps normal motion to return to the joints. This allows healthier, less painful movement and activity. An effective stretch should be held for at least 30 seconds. A stretch should never be painful. You should only feel a gentle lengthening or release in the stretched tissue.  RANGE OF MOTION - Toe Extension, Flexion Sit with your right / left leg crossed over your opposite knee. Grasp your toes and gently pull them back toward the top of your foot. You should feel a stretch on the bottom of your toes and/or foot. Hold this stretch for 10 seconds. Now, gently pull your toes toward the bottom of your foot. You should feel a stretch on the top of your toes and or foot. Hold this stretch for 10 seconds. Repeat  times. Complete this stretch 3 times per day.   RANGE OF MOTION - Ankle Dorsiflexion, Active Assisted Remove shoes and sit on a chair that is preferably not on a carpeted surface. Place right / left foot under knee. Extend your opposite leg for support. Keeping your heel down, slide your right / left foot back toward the chair until you feel a stretch at your ankle or calf. If you do not feel a stretch, slide your bottom forward to the edge of the chair, while still keeping your heel down. Hold this stretch for 10 seconds. Repeat 3 times. Complete this stretch 2 times per day.   STRETCH  Gastroc, Standing Place hands on wall. Extend right / left leg, keeping the  front knee somewhat bent. Slightly point your toes inward on your back foot. Keeping your right / left heel on the floor and your knee straight, shift your weight toward the wall, not allowing your back to arch. You should feel a gentle stretch in the right / left calf. Hold this position for 10 seconds. Repeat 3 times. Complete this stretch 2 times per day.  STRETCH  Soleus, Standing Place hands on wall. Extend right / left leg, keeping the other knee somewhat bent. Slightly point your toes inward on your back foot. Keep your right / left heel on the floor, bend your back knee, and slightly shift your weight over the back leg so that you feel a gentle stretch deep in your back calf. Hold this position for 10 seconds. Repeat 3 times. Complete this stretch 2 times per day.  STRETCH  Gastrocsoleus, Standing  Note: This exercise can place a lot of stress on your foot and ankle. Please complete this exercise only if specifically instructed by your caregiver.  Place the ball of your right / left foot on a step, keeping your other foot firmly on the same step. Hold on to the wall or a rail for balance. Slowly lift your other foot, allowing your body weight to press your heel down over the edge of the step. You should feel a stretch in your right / left calf. Hold this position for 10 seconds. Repeat this exercise with a   left knee. Repeat 3 times. Complete this stretch 2 times per day.   STRENGTHENING EXERCISES - Plantar Fasciitis (Heel Spur Syndrome)  These exercises may help you when beginning to rehabilitate your injury. They may resolve your symptoms with or without further involvement from your physician, physical therapist or athletic trainer. While completing these exercises, remember:  Muscles can gain both the endurance and the strength needed for everyday activities through controlled exercises. Complete these exercises as instructed by your physician, physical  therapist or athletic trainer. Progress the resistance and repetitions only as guided.  STRENGTH - Towel Curls Sit in a chair positioned on a non-carpeted surface. Place your foot on a towel, keeping your heel on the floor. Pull the towel toward your heel by only curling your toes. Keep your heel on the floor. Repeat 3 times. Complete this exercise 2 times per day.  STRENGTH - Ankle Inversion Secure one end of a rubber exercise band/tubing to a fixed object (table, pole). Loop the other end around your foot just before your toes. Place your fists between your knees. This will focus your strengthening at your ankle. Slowly, pull your big toe up and in, making sure the band/tubing is positioned to resist the entire motion. Hold this position for 10 seconds. Have your muscles resist the band/tubing as it slowly pulls your foot back to the starting position. Repeat 3 times. Complete this exercises 2 times per day.  Document Released: 01/15/2005 Document Revised: 04/09/2011 Document Reviewed: 04/29/2008 [facility] Patient Information 2014 [place], [place].   Meloxicam Tablets What is this medication? MELOXICAM (mel OX i cam) treats mild to moderate pain, inflammation, or arthritis. It works by decreasing inflammation. It belongs to a group of medications called NSAIDs. This medicine may be used for other purposes; ask your health care provider or pharmacist if you have questions. COMMON BRAND NAME(S): Mobic What should I tell my care team before I take this medication? They need to know if you have any of these conditions: Asthma (lung or breathing disease) Bleeding disorder Coronary artery bypass graft (CABG) within the past 2 weeks Dehydration Heart attack Heart disease Heart failure High blood pressure If you often drink alcohol Kidney disease Liver disease Smoke tobacco cigarettes Stomach bleeding Stomach ulcers, other stomach or intestine problems Take medications that treat or  prevent blood clots Taking other steroids like dexamethasone or prednisone An unusual or allergic reaction to meloxicam, other medications, foods, dyes, or preservatives Pregnant or trying to get pregnant Breast-feeding How should I use this medication? Take this medication by mouth. Take it as directed on the prescription label at the same time every day. You can take it with or without food. If it upsets your stomach, take it with food. Do not use it more often than directed. There may be unused or extra doses in the bottle after you finish your treatment. Talk to your care team if you have questions about your dose. A special MedGuide will be given to you by the pharmacist with each prescription and refill. Be sure to read this information carefully each time. Talk to your care team about the use of this medication in children. Special care may be needed. Patients over 45 years of age may have a stronger reaction and need a smaller dose. Overdosage: If you think you have taken too much of this medicine contact a poison control center or emergency room at once. NOTE: This medicine is only for you. Do not share this medicine with others. What if  I miss a dose? If you miss a dose, take it as soon as you can. If it is almost time for your next dose, take only that dose. Do not take double or extra doses. What may interact with this medication? Do not take this medication with any of the following: Cidofovir Ketorolac This medication may also interact with the following: Aspirin and aspirin-like medications Certain medications for blood pressure, heart disease, irregular heart beat Certain medications for depression, anxiety, or psychotic disturbances Certain medications that treat or prevent blood clots like warfarin, enoxaparin, dalteparin, apixaban, dabigatran, rivaroxaban Cyclosporine Diuretics Fluconazole Lithium Methotrexate Other NSAIDs, medications for pain and inflammation, like  ibuprofen and naproxen Pemetrexed This list may not describe all possible interactions. Give your health care provider a list of all the medicines, herbs, non-prescription drugs, or dietary supplements you use. Also tell them if you smoke, drink alcohol, or use illegal drugs. Some items may interact with your medicine. What should I watch for while using this medication? Visit your care team for regular checks on your progress. Tell your care team if your symptoms do not start to get better or if they get worse. Do not take other medications that contain aspirin, ibuprofen, or naproxen with this medication. Side effects such as stomach upset, nausea, or ulcers may be more likely to occur. Many non-prescription medications contain aspirin, ibuprofen, or naproxen. Always read labels carefully. This medication can cause serious ulcers and bleeding in the stomach. It can happen with no warning. Smoking, drinking alcohol, older age, and poor health can also increase risks. Call your care team right away if you have stomach pain or blood in your vomit or stool. This medication does not prevent a heart attack or stroke. This medication may increase the chance of a heart attack or stroke. The chance may increase the longer you use this medication or if you have heart disease. If you take aspirin to prevent a heart attack or stroke, talk to your care team about using this medication. Alcohol may interfere with the effect of this medication. Avoid alcoholic drinks. This medication may cause serious skin reactions. They can happen weeks to months after starting the medication. Contact your care team right away if you notice fevers or flu-like symptoms with a rash. The rash may be red or purple and then turn into blisters or peeling of the skin. Or, you might notice a red rash with swelling of the face, lips or lymph nodes in your neck or under your arms. Talk to your care team if you are pregnant before taking this  medication. Taking this medication between weeks 20 and 30 of pregnancy may harm your unborn baby. Your care team will monitor you closely if you need to take it. After 30 weeks of pregnancy, do not take this medication. You may get drowsy or dizzy. Do not drive, use machinery, or do anything that needs mental alertness until you know how this medication affects you. Do not stand up or sit up quickly, especially if you are an older patient. This reduces the risk of dizzy or fainting spells. Be careful brushing or flossing your teeth or using a toothpick because you may get an infection or bleed more easily. If you have any dental work done, tell your dentist you are receiving this medication. This medication may make it more difficult to get pregnant. Talk to your care team if you are concerned about your fertility. What side effects may I notice from receiving this  medication? Side effects that you should report to your care team as soon as possible: Allergic reactions--skin rash, itching, hives, swelling of the face, lips, tongue, or throat Bleeding--bloody or black, tar-like stools, vomiting blood or brown material that looks like coffee grounds, red or dark brown urine, small red or purple spots on skin, unusual bruising or bleeding Heart attack--pain or tightness in the chest, shoulders, arms, or jaw, nausea, shortness of breath, cold or clammy skin, feeling faint or lightheaded Heart failure--shortness of breath, swelling of ankles, feet, or hands, sudden weight gain, unusual weakness or fatigue Increase in blood pressure Kidney injury--decrease in the amount of urine, swelling of the ankles, hands, or feet Liver injury--right upper belly pain, loss of appetite, nausea, light-colored stool, dark yellow or brown urine, yellowing skin or eyes, unusual weakness or fatigue Rash, fever, and swollen lymph nodes Redness, blistering, peeling, or loosening of the skin, including inside the  mouth Stroke--sudden numbness or weakness of the face, arm, or leg, trouble speaking, confusion, trouble walking, loss of balance or coordination, dizziness, severe headache, change in vision Side effects that usually do not require medical attention (report to your care team if they continue or are bothersome): Diarrhea Nausea Upset stomach This list may not describe all possible side effects. Call your doctor for medical advice about side effects. You may report side effects to FDA at 1-800-FDA-1088. Where should I keep my medication? Keep out of the reach of children and pets. Store at room temperature between 20 and 25 degrees C (68 and 77 degrees F). Protect from moisture. Keep the container tightly closed. Get rid of any unused medication after the expiration date. To get rid of medications that are no longer needed or have expired: Take the medication to a medication take-back program. Check with your pharmacy or law enforcement to find a location. If you cannot return the medication, check the label or package insert to see if the medication should be thrown out in the garbage or flushed down the toilet. If you are not sure, ask your care team. If it is safe to put it in the trash, empty the medication out of the container. Mix the medication with cat litter, dirt, coffee grounds, or other unwanted substance. Seal the mixture in a bag or container. Put it in the trash. NOTE: This sheet is a summary. It may not cover all possible information. If you have questions about this medicine, talk to your doctor, pharmacist, or health care provider.  2023 Elsevier/Gold Standard (2007-03-08 00:00:00)

## 2021-11-16 NOTE — Progress Notes (Signed)
Subjective:   Patient ID: Epimenio Sarin, female   DOB: 51 y.o.   MRN: [id number]   HPI Chief Complaint  Patient presents with   Foot Pain    Bilateral heel pain in the mornings, started 2 months ago, rate of pain 10 out of 10, X-Rays done today     51 year old female presents the above concerns.  Pain is worse in the morning and as she starts walking it feels better. Also describes pain after sitting and stand up to start walking it hurts but as she continues it improves. No injury. No numbness or tingling. Her doctor told her to ice it but she has not tried it.   Last A1c 6.5 on 09/27/2021   Review of Systems  All other systems reviewed and are negative.  Past Medical History:  Diagnosis Date   Hyperlipidemia    Hypertension    Prediabetes     No past surgical history on file.   Current Outpatient Medications:    estradiol (ESTRACE) 0.1 MG/GM vaginal cream, Place 1 Applicatorful vaginally 2 (two) times a week., Disp: 42.5 g, Rfl: 0   hydrochlorothiazide (HYDRODIURIL) 12.5 MG tablet, Take 1 tablet (12.5 mg total) by mouth daily., Disp: 90 tablet, Rfl: 1   hydrocortisone (ANUSOL-HC) 25 MG suppository, Place 1 suppository (25 mg total) rectally 2 (two) times daily., Disp: 12 suppository, Rfl: 0   ketoconazole (NIZORAL) 2 % cream, Apply 1 Application topically daily as needed for irritation (itching)., Disp: 15 g, Rfl: 0   lisinopril (ZESTRIL) 10 MG tablet, Take 1 tablet (10 mg total) by mouth daily., Disp: 90 tablet, Rfl: 1   metFORMIN (GLUCOPHAGE-XR) 500 MG 24 hr tablet, TAKE 1 TABLET(500 MG) BY MOUTH DAILY WITH BREAKFAST, Disp: 90 tablet, Rfl: 1   polyethylene glycol powder (GLYCOLAX/MIRALAX) 17 GM/SCOOP powder, Take 17 g by mouth daily as needed for mild constipation., Disp: 3350 g, Rfl: 1   rosuvastatin (CRESTOR) 20 MG tablet, TAKE 1 TABLET(20 MG) BY MOUTH DAILY, Disp: 90 tablet, Rfl: 1  Allergies  Allergen Reactions   Amlodipine Swelling   Ferrous Gluconate Hives           Objective:  Physical Exam  General: AAO x3, NAD  Dermatological: Skin is warm, dry and supple bilateral. Nails x 10 are well manicured; remaining integument appears unremarkable at this time. There are no open sores, no preulcerative lesions, no rash or signs of infection present.  Vascular: Dorsalis Pedis artery and Posterior Tibial artery pedal pulses are 2/4 bilateral with immedate capillary fill time. There is no pain with calf compression, swelling, warmth, erythema.   Neruologic: Grossly intact via light touch bilateral.  Negative Tinel sign. Musculoskeletal: Pain along the insertion of the plantar fascia right worse than left and also along the posterior heel left worse than right.  There is no pain with lateral compression of the calcaneus.  No edema, erythema.  Flexor, extensor tendons intact.  MMT 5/5.  Equinus present.  Gait: Unassisted, Nonantalgic.       Assessment:   51 year old female with plantar fasciitis, insertional Achilles tendinitis     Plan:  -Treatment options discussed including all alternatives, risks, and complications -Etiology of symptoms were discussed -X-rays were obtained and reviewed with the patient.  3 views bilateral feet were obtained.  No evidence of acute fracture or stress fracture.  Calcaneal spurring is present. -Plantar fascial braces were dispensed to help support the plantar fascia. -Stretching, icing daily. -Discussed shoe modifications and good arch support. -Prescribed mobic.  Discussed side effects of the medication and directed to stop if any are to occur and call the office.   No follow-ups on file.  Trula Slade DPM

## 2021-11-28 ENCOUNTER — Other Ambulatory Visit: Payer: Self-pay | Admitting: Podiatry

## 2021-11-28 DIAGNOSIS — M722 Plantar fascial fibromatosis: Secondary | ICD-10-CM

## 2021-12-12 ENCOUNTER — Encounter: Payer: 59 | Admitting: Nurse Practitioner

## 2021-12-27 ENCOUNTER — Encounter: Payer: Self-pay | Admitting: Nurse Practitioner

## 2021-12-27 ENCOUNTER — Ambulatory Visit (INDEPENDENT_AMBULATORY_CARE_PROVIDER_SITE_OTHER): Payer: 59 | Admitting: Nurse Practitioner

## 2021-12-27 VITALS — BP 134/82 | HR 62 | Temp 97.4°F | Ht 61.5 in | Wt 201.4 lb

## 2021-12-27 DIAGNOSIS — Z0000 Encounter for general adult medical examination without abnormal findings: Secondary | ICD-10-CM

## 2021-12-27 DIAGNOSIS — J302 Other seasonal allergic rhinitis: Secondary | ICD-10-CM

## 2021-12-27 DIAGNOSIS — E119 Type 2 diabetes mellitus without complications: Secondary | ICD-10-CM

## 2021-12-27 DIAGNOSIS — I10 Essential (primary) hypertension: Secondary | ICD-10-CM | POA: Diagnosis not present

## 2021-12-27 DIAGNOSIS — Z23 Encounter for immunization: Secondary | ICD-10-CM

## 2021-12-27 MED ORDER — METFORMIN HCL ER 500 MG PO TB24: ORAL_TABLET | ORAL | 1 refills | Status: AC

## 2021-12-27 MED ORDER — MELOXICAM 7.5 MG PO TABS: 7.5000 mg | ORAL_TABLET | Freq: Every day | ORAL | 1 refills | Status: DC | PRN

## 2021-12-27 MED ORDER — LORATADINE 10 MG PO TABS: 10.0000 mg | ORAL_TABLET | Freq: Every day | ORAL | 3 refills | Status: AC

## 2021-12-27 NOTE — Assessment & Plan Note (Signed)
Most likely causing her scratchy throat and nose. She can start loratadine 10mg  daily for her symptoms.

## 2021-12-27 NOTE — Patient Instructions (Signed)
It was great to see you!  Start loratadine (claritin) 10mg  daily as needed for throat itching   I have refilled your meloxicam and metformin.   Let's follow-up in 3 months, sooner if you have concerns.  If a referral was placed today, you will be contacted for an appointment. Please note that routine referrals can sometimes take up to 3-4 weeks to process. Please call our office if you haven't heard anything after this time frame.  Take care,  , NP

## 2021-12-27 NOTE — Progress Notes (Signed)
BP 134/82 (BP Location: Left Arm, Patient Position: Sitting, Cuff Size: Normal)   Pulse 62   Temp (!) 97.4 F (36.3 C) (Temporal)   Ht 5' 1.5" (1.562 m)   Wt 201 lb 6.4 oz (91.4 kg)   SpO2 96%   BMI 37.44 kg/m    Subjective:    Patient ID: Kristin Matthews, female    DOB: 1970-12-13, 51 y.o.   MRN: [id number]  CC: Chief Complaint  Patient presents with   Annual Exam    CPE wants to discuss having allergies tickle in throat x2 weeks not fasting.   HPI: Kristin Matthews is a 51 y.o. female presenting on 12/27/2021 for comprehensive medical examination. Current medical complaints include:yes  She has been having a tickle or itching in throat associated with phelgm. She feels like she needs to clear her throat. She states that sometimes her nose will itch as well. She has not tried anything at home.   She currently lives with: 2 roommates Menopausal Symptoms: no  Depression Screen done today and results listed below:     12/27/2021    9:20 AM 04/11/2021    9:27 AM 03/15/2021    1:13 PM  Depression screen PHQ 2/9  Decreased Interest 0 0 0  Down, Depressed, Hopeless 0 0 0  PHQ - 2 Score 0 0 0  Altered sleeping  0   Tired, decreased energy  0   Change in appetite  0   Feeling bad or failure about yourself   0   Trouble concentrating  0   Moving slowly or fidgety/restless  0   Suicidal thoughts  0   PHQ-9 Score  0     The patient does not have a history of falls. I did not complete a risk assessment for falls. A plan of care for falls was not documented.   Past Medical History:  Past Medical History:  Diagnosis Date   Hyperlipidemia    Hypertension    Prediabetes     Surgical History:  History reviewed. No pertinent surgical history.  Medications:  Current Outpatient Medications on File Prior to Visit  Medication Sig   estradiol (ESTRACE) 0.1 MG/GM vaginal cream Place 1 Applicatorful vaginally 2 (two) times a week.   hydrochlorothiazide (HYDRODIURIL) 12.5 MG tablet Take 1  tablet (12.5 mg total) by mouth daily.   hydrocortisone (ANUSOL-HC) 25 MG suppository Place 1 suppository (25 mg total) rectally 2 (two) times daily.   ketoconazole (NIZORAL) 2 % cream Apply 1 Application topically daily as needed for irritation (itching).   lisinopril (ZESTRIL) 10 MG tablet Take 1 tablet (10 mg total) by mouth daily.   polyethylene glycol powder (GLYCOLAX/MIRALAX) 17 GM/SCOOP powder Take 17 g by mouth daily as needed for mild constipation.   rosuvastatin (CRESTOR) 20 MG tablet TAKE 1 TABLET(20 MG) BY MOUTH DAILY   No current facility-administered medications on file prior to visit.    Allergies:  Allergies  Allergen Reactions   Amlodipine Swelling   Ferrous Gluconate Hives    Social History:   Social History   Tobacco Use  Smoking Status Never  Smokeless Tobacco Never   Social History   Substance and Sexual Activity  Alcohol Use Yes   Comment: occ    Family History:  Family History  Adopted: Yes    Past medical history, surgical history, medications, allergies, family history and social history reviewed with patient today and changes made to appropriate areas of the chart.   Review of Systems  Constitutional: Negative.   HENT:  Positive for sore throat (itchy/tickle in throat). Negative for congestion and ear pain.   Eyes: Negative.   Respiratory: Negative.    Cardiovascular: Negative.   Gastrointestinal: Negative.   Genitourinary: Negative.   Musculoskeletal:        Foot pain, improving  Skin: Negative.   Neurological: Negative.   Psychiatric/Behavioral: Negative.     All other ROS negative except what is listed above and in the HPI.      Objective:    BP 134/82 (BP Location: Left Arm, Patient Position: Sitting, Cuff Size: Normal)   Pulse 62   Temp (!) 97.4 F (36.3 C) (Temporal)   Ht 5' 1.5" (1.562 m)   Wt 201 lb 6.4 oz (91.4 kg)   SpO2 96%   BMI 37.44 kg/m   Wt Readings from Last 3 Encounters:  12/27/21 201 lb 6.4 oz (91.4 kg)   10/25/21 198 lb (89.8 kg)  09/27/21 202 lb 6.4 oz (91.8 kg)    Physical Exam Vitals and nursing note reviewed.  Constitutional:      General: She is not in acute distress.    Appearance: Normal appearance.  HENT:     Head: Normocephalic and atraumatic.     Right Ear: Tympanic membrane, ear canal and external ear normal.     Left Ear: Tympanic membrane, ear canal and external ear normal.  Eyes:     Conjunctiva/sclera: Conjunctivae normal.  Cardiovascular:     Rate and Rhythm: Normal rate and regular rhythm.     Pulses: Normal pulses.     Heart sounds: Normal heart sounds.  Pulmonary:     Effort: Pulmonary effort is normal.     Breath sounds: Normal breath sounds.  Abdominal:     Palpations: Abdomen is soft.     Tenderness: There is no abdominal tenderness.  Musculoskeletal:        General: Normal range of motion.     Cervical back: Normal range of motion and neck supple. No tenderness.     Right lower leg: No edema.     Left lower leg: No edema.  Lymphadenopathy:     Cervical: No cervical adenopathy.  Skin:    General: Skin is warm and dry.  Neurological:     General: No focal deficit present.     Mental Status: She is alert and oriented to person, place, and time.     Cranial Nerves: No cranial nerve deficit.     Coordination: Coordination normal.     Gait: Gait normal.  Psychiatric:        Mood and Affect: Mood normal.        Behavior: Behavior normal.        Thought Content: Thought content normal.        Judgment: Judgment normal.     Results for orders placed or performed in visit on 10/25/21  HM DIABETES EYE EXAM  Result Value Ref Range   HM Diabetic Eye Exam No Retinopathy No Retinopathy      Assessment & Plan:   Problem List Items Addressed This Visit       Cardiovascular and Mediastinum   Primary hypertension    Chronic, stable. Continue lisinopril 10mg  and hctz 12.5mg  daily. Follow-up in 6 months.         Respiratory   Seasonal allergic  rhinitis    Most likely causing her scratchy throat and nose. She can start loratadine 10mg  daily for her symptoms.  Endocrine   Diabetes mellitus without complication (HCC)    Chronic, stable. Continue metformin 500mg  daily and rosuvastatin 20mg  daily. She is up to date on flu, will update pneumonia vaccine today. Will check labs next visit. Follow-up in 3 months.       Relevant Medications   metFORMIN (GLUCOPHAGE-XR) 500 MG 24 hr tablet   Other Visit Diagnoses     Routine general medical examination at a health care facility    -  Primary   Health maintenance reviewed and updated. Discussed nutrition, exercise. Follow-up 1 year.   Need for pneumococcal 20-valent conjugate vaccination       Prevnar 20 given today with history of diabetes.   Relevant Orders   Pneumococcal conjugate vaccine 20-valent (Completed)        Follow up plan: Return in about 3 months (around 03/29/2022) for Diabetes.   LABORATORY TESTING:  - Pap smear: up to date  IMMUNIZATIONS:   - Tdap: Tetanus vaccination status reviewed: last tetanus booster within 10 years. - Influenza: Up to date - Prevnar: Administered today - HPV: Not applicable - Zostavax vaccine: Refused  SCREENING: -Mammogram: Up to date  - Colonoscopy: Up to date  - Bone Density: Not applicable  -Hearing Test: Not applicable  -Spirometry: Not applicable   PATIENT COUNSELING:   Advised to take 1 mg of folate supplement per day if capable of pregnancy.   Sexuality: Discussed sexually transmitted diseases, partner selection, use of condoms, avoidance of unintended pregnancy  and contraceptive alternatives.   Advised to avoid cigarette smoking.  I discussed with the patient that most people either abstain from alcohol or drink within safe limits (<=14/week and <=4 drinks/occasion for males, <=7/weeks and <= 3 drinks/occasion for females) and that the risk for alcohol disorders and other health effects rises proportionally  with the number of drinks per week and how often a drinker exceeds daily limits.  Discussed cessation/primary prevention of drug use and availability of treatment for abuse.   Diet: Encouraged to adjust caloric intake to maintain  or achieve ideal body weight, to reduce intake of dietary saturated fat and total fat, to limit sodium intake by avoiding high sodium foods and not adding table salt, and to maintain adequate dietary potassium and calcium preferably from fresh fruits, vegetables, and low-fat dairy products.    stressed the importance of regular exercise  Injury prevention: Discussed safety belts, safety helmets, smoke detector, smoking near bedding or upholstery.   Dental health: Discussed importance of regular tooth brushing, flossing, and dental visits.    NEXT PREVENTATIVE PHYSICAL DUE IN 1 YEAR. Return in about 3 months (around 03/29/2022) for Diabetes.

## 2021-12-27 NOTE — Assessment & Plan Note (Signed)
Chronic, stable. Continue lisinopril 10mg  and hctz 12.5mg  daily. Follow-up in 6 months.

## 2021-12-27 NOTE — Assessment & Plan Note (Signed)
Chronic, stable. Continue metformin 500mg  daily and rosuvastatin 20mg  daily. She is up to date on flu, will update pneumonia vaccine today. Will check labs next visit. Follow-up in 3 months.

## 2022-03-28 ENCOUNTER — Ambulatory Visit: Payer: 59 | Admitting: Nurse Practitioner

## 2022-06-11 ENCOUNTER — Other Ambulatory Visit: Payer: Self-pay | Admitting: Nurse Practitioner

## 2022-06-11 NOTE — Telephone Encounter (Signed)
Requesting: ROSUVASTATIN 20MG  TABLETS  Last Visit: 12/27/2021 Next Visit: Visit date not found Last Refill: 10/25/2021  Please Advise

## 2022-07-28 ENCOUNTER — Other Ambulatory Visit: Payer: Self-pay | Admitting: Nurse Practitioner

## 2022-07-30 ENCOUNTER — Other Ambulatory Visit: Payer: Self-pay | Admitting: Nurse Practitioner

## 2022-08-27 ENCOUNTER — Other Ambulatory Visit: Payer: Self-pay | Admitting: Nurse Practitioner

## 2022-08-30 NOTE — Telephone Encounter (Signed)
Requesting: Meloxicam and Rosuvastatin Last Visit: 12/27/2021 Next Visit: Visit date not found Last Refill: 12/27/2021 and 07/30/2022  Please Advise    LVM to return call to set up an appointment

## 2022-10-02 ENCOUNTER — Other Ambulatory Visit: Payer: Self-pay | Admitting: Nurse Practitioner

## 2022-10-03 NOTE — Telephone Encounter (Signed)
Requesting: HYDROCHLOROTHIAZIDE 12.5MG  TABLETS  Last Visit: 12/27/2021 Next Visit: Visit date not found Last Refill: 10/25/2021  Please Advise

## 2022-11-23 ENCOUNTER — Other Ambulatory Visit: Payer: Self-pay | Admitting: Family

## 2022-11-28 NOTE — Telephone Encounter (Signed)
Per last note form Lauren, patient has established with a new PCP

## 2022-12-07 ENCOUNTER — Other Ambulatory Visit: Payer: Self-pay | Admitting: Nurse Practitioner

## 2022-12-07 NOTE — Telephone Encounter (Signed)
 Per last note form Lauren, patient has established with a new PCP

## 2022-12-23 ENCOUNTER — Other Ambulatory Visit: Payer: Self-pay | Admitting: Nurse Practitioner

## 2022-12-23 DIAGNOSIS — E119 Type 2 diabetes mellitus without complications: Secondary | ICD-10-CM

## 2022-12-24 NOTE — Telephone Encounter (Signed)
Requesting: METFORMIN ER 500MG  24HR TABS  Last Visit: 12/27/2021 Next Visit: Visit date not found Last Refill: 12/27/2021  Please Advise   Per note on 10/02/2022 form Kristin Matthews, patient has established with a new PCP

## 2023-02-21 ENCOUNTER — Other Ambulatory Visit: Payer: Self-pay | Admitting: Family

## 2023-02-21 ENCOUNTER — Other Ambulatory Visit: Payer: Self-pay | Admitting: Nurse Practitioner

## 2023-02-21 NOTE — Telephone Encounter (Signed)
Requesting: LORATADINE 10MG  TABLETS  Last Visit: 12/27/2021 Next Visit: Visit date not found Last Refill: 12/27/2021  Please Advise

## 2023-03-28 ENCOUNTER — Other Ambulatory Visit: Payer: Self-pay | Admitting: Family

## 2023-03-28 ENCOUNTER — Other Ambulatory Visit: Payer: Self-pay | Admitting: Nurse Practitioner

## 2023-03-28 DIAGNOSIS — E119 Type 2 diabetes mellitus without complications: Secondary | ICD-10-CM

## 2023-03-28 NOTE — Telephone Encounter (Addendum)
 Requesting: METFORMIN ER 500MG  24HR TABS  Last Visit: Visit date not found Next Visit: Visit date not found Last Refill: 12/27/2021  Please Advise     Patient established care today-03/28/23 with Mortimer Fries and she refilled medication.

## 2023-05-24 IMAGING — MG MM DIGITAL SCREENING BILAT W/ TOMO AND CAD
8 series · 8 of 24 positions shown · non-contrast
Comparison: Previous exam(s).

CLINICAL DATA: Screening.

EXAM:
DIGITAL SCREENING BILATERAL MAMMOGRAM WITH TOMOSYNTHESIS AND CAD
TECHNIQUE: Bilateral screening digital craniocaudal and mediolateral oblique
mammograms were obtained. Bilateral screening digital breast
tomosynthesis was performed. The images were evaluated with
computer-aided detection.

[L CC synth-2D]
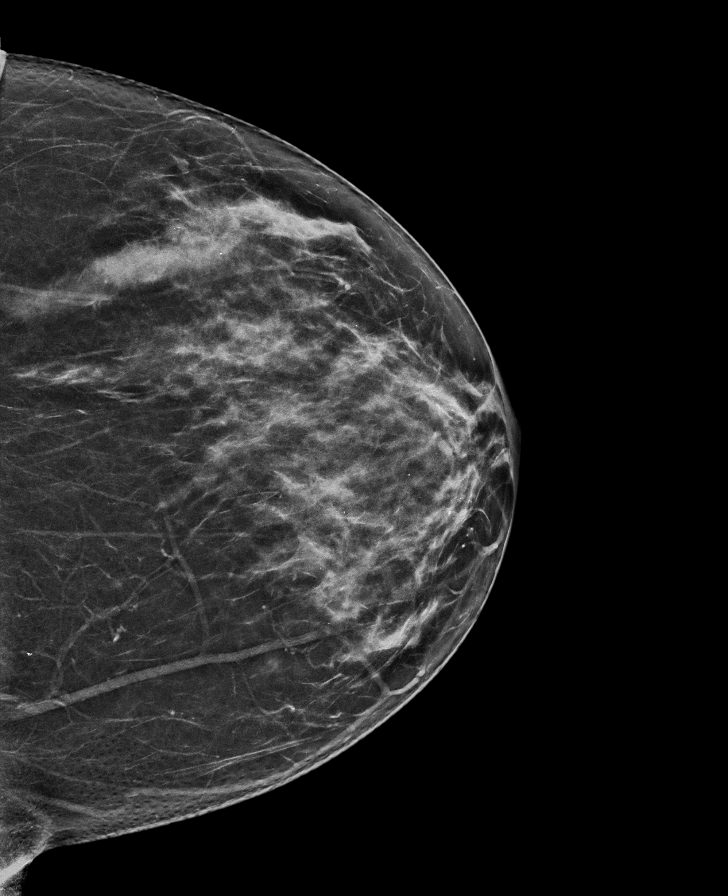

[L MLO synth-2D]
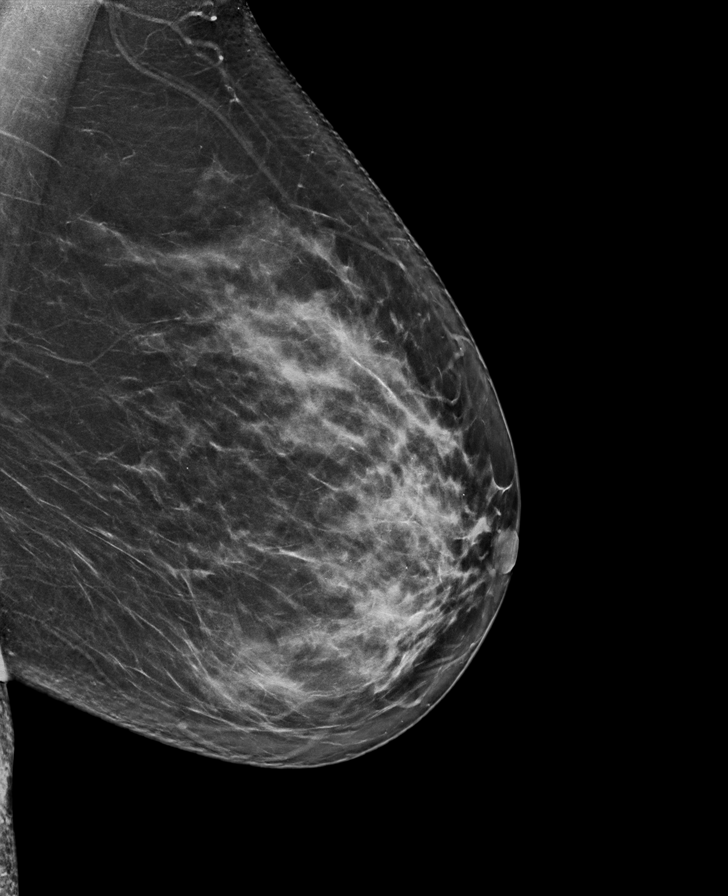

[R MLO synth-2D]
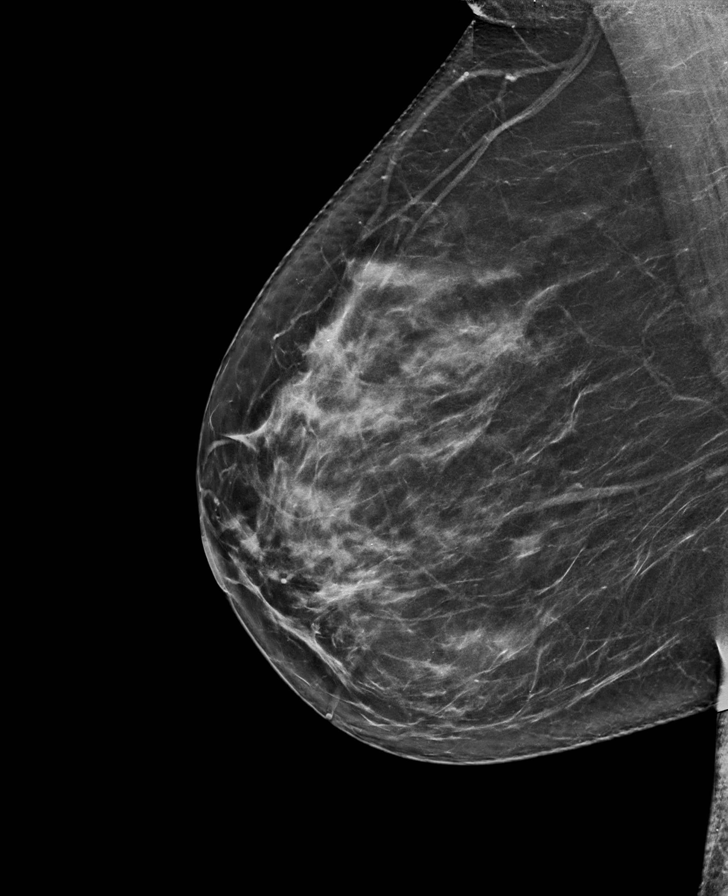

[R CC synth-2D]
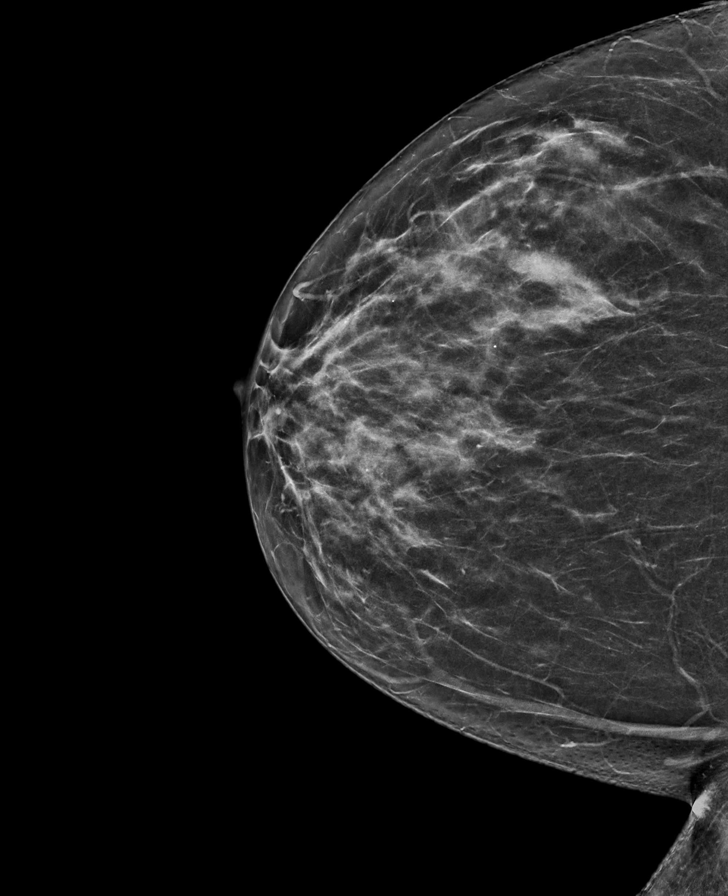

[L CC tomo · tomo slice 37/72.0]
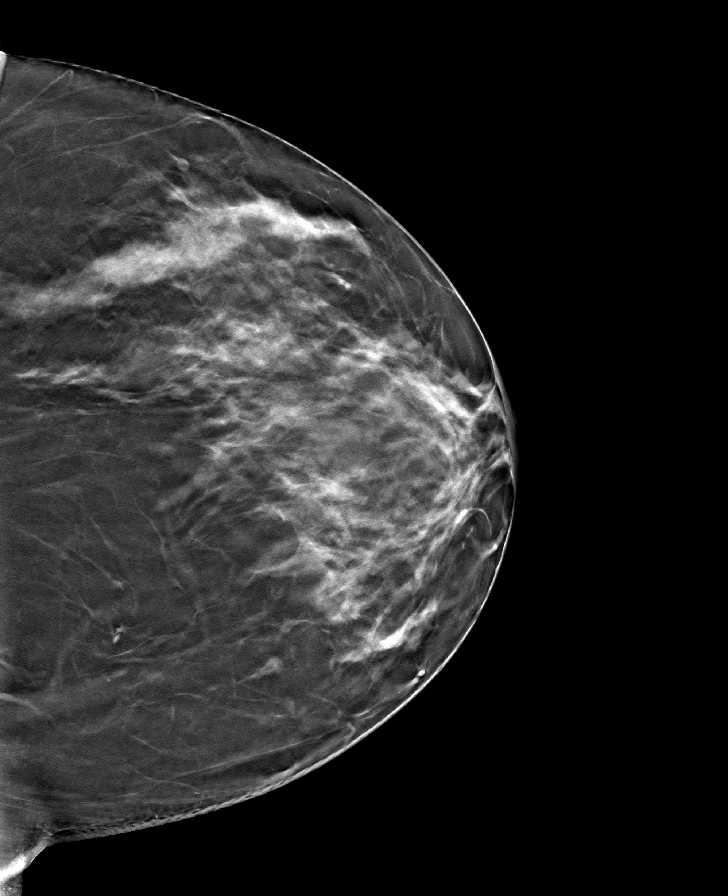

[R CC tomo · tomo slice 37/72.0]
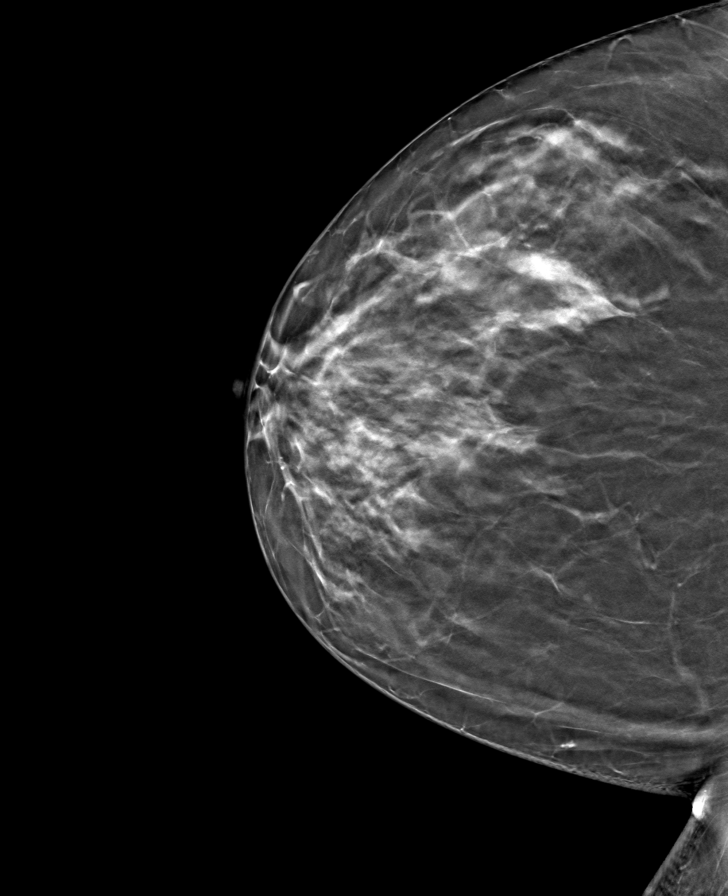

[R MLO tomo · tomo slice 41/82.0]
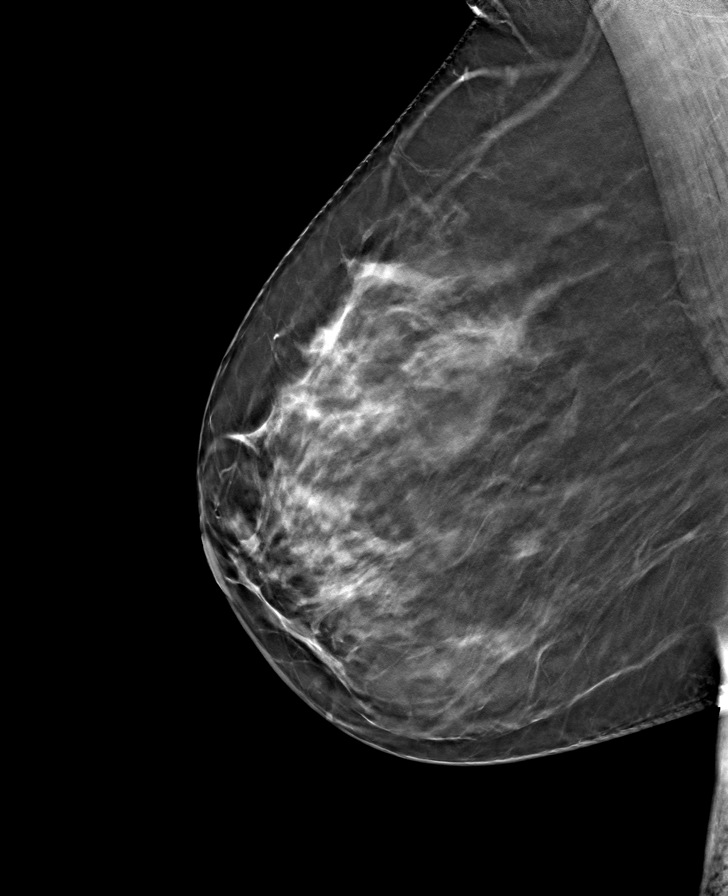

[L MLO tomo · tomo slice 43/86.0]
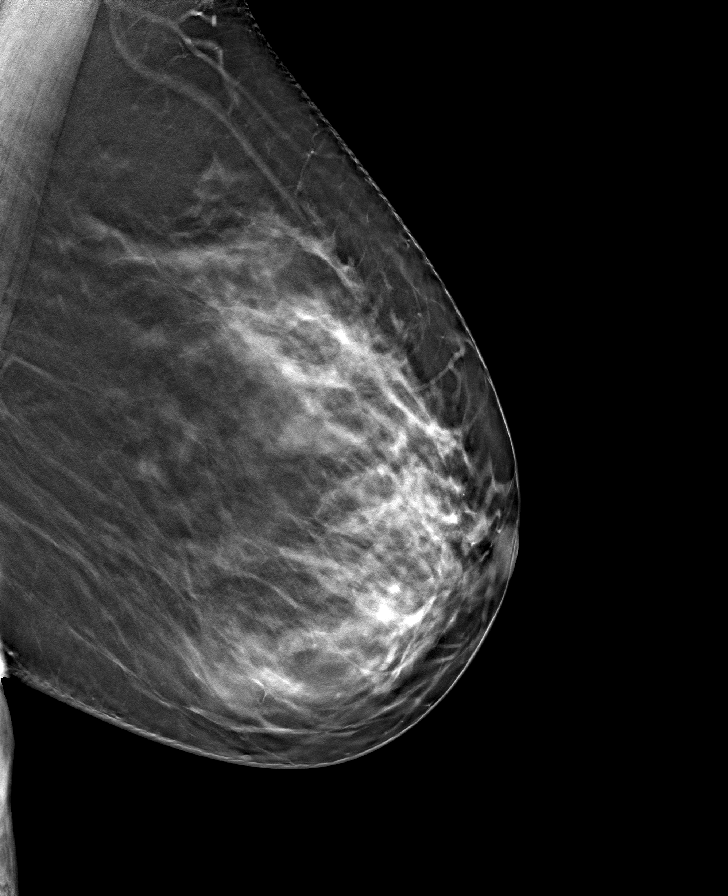

[8 of 24 positions shown; findings below may reference images not displayed]

ACR Breast Density Category c: The breast tissue is heterogeneously
dense, which may obscure small masses.
FINDINGS: There are no findings suspicious for malignancy.
IMPRESSION: No mammographic evidence of malignancy. A result letter of this
screening mammogram will be mailed directly to the patient.

RECOMMENDATION:
Screening mammogram in one year. (Code:Q3-W-BC3)

BI-RADS CATEGORY  1: Negative.
